# Patient Record
Sex: Female | Born: 1935 | Race: White | Hispanic: No | Smoking: Former smoker
Health system: Southern US, Community
[De-identification: ages and names within clinical notes are randomized; demographics above are authoritative.]

## PROBLEM LIST (undated history)

## (undated) DIAGNOSIS — J3489 Other specified disorders of nose and nasal sinuses: Secondary | ICD-10-CM

## (undated) DIAGNOSIS — C801 Malignant (primary) neoplasm, unspecified: Secondary | ICD-10-CM

## (undated) DIAGNOSIS — E7800 Pure hypercholesterolemia, unspecified: Secondary | ICD-10-CM

## (undated) DIAGNOSIS — H548 Legal blindness, as defined in USA: Secondary | ICD-10-CM

## (undated) DIAGNOSIS — K449 Diaphragmatic hernia without obstruction or gangrene: Secondary | ICD-10-CM

## (undated) DIAGNOSIS — I2699 Other pulmonary embolism without acute cor pulmonale: Secondary | ICD-10-CM

## (undated) DIAGNOSIS — I10 Essential (primary) hypertension: Secondary | ICD-10-CM

## (undated) DIAGNOSIS — H9190 Unspecified hearing loss, unspecified ear: Secondary | ICD-10-CM

## (undated) HISTORY — DX: Malignant (primary) neoplasm, unspecified: C80.1

## (undated) HISTORY — DX: Pure hypercholesterolemia, unspecified: E78.00

## (undated) HISTORY — PX: DILATION AND CURETTAGE OF UTERUS: SHX78

## (undated) HISTORY — DX: Diaphragmatic hernia without obstruction or gangrene: K44.9

## (undated) HISTORY — DX: Essential (primary) hypertension: I10

## (undated) HISTORY — DX: Other specified disorders of nose and nasal sinuses: J34.89

---

## 1992-07-09 HISTORY — PX: BLADDER REMOVAL: SHX567

## 1998-04-18 ENCOUNTER — Other Ambulatory Visit: Admission: RE | Admit: 1998-04-18 | Discharge: 1998-04-18 | Payer: Self-pay | Admitting: Family Medicine

## 2007-08-28 ENCOUNTER — Emergency Department (HOSPITAL_COMMUNITY): Admission: EM | Admit: 2007-08-28 | Discharge: 2007-08-28 | Payer: Self-pay | Admitting: Emergency Medicine

## 2007-10-05 ENCOUNTER — Encounter: Admission: RE | Admit: 2007-10-05 | Discharge: 2007-10-06 | Payer: Self-pay | Admitting: Ophthalmology

## 2009-01-14 ENCOUNTER — Emergency Department (HOSPITAL_COMMUNITY): Admission: EM | Admit: 2009-01-14 | Discharge: 2009-01-14 | Payer: Self-pay | Admitting: Emergency Medicine

## 2010-09-12 ENCOUNTER — Encounter
Admission: RE | Admit: 2010-09-12 | Discharge: 2010-09-12 | Payer: Self-pay | Source: Home / Self Care | Attending: Internal Medicine | Admitting: Internal Medicine

## 2010-09-13 ENCOUNTER — Emergency Department (HOSPITAL_COMMUNITY)
Admission: EM | Admit: 2010-09-13 | Discharge: 2010-09-14 | Payer: Self-pay | Source: Home / Self Care | Admitting: Emergency Medicine

## 2010-09-23 LAB — COMPREHENSIVE METABOLIC PANEL
ALT: 22 U/L (ref 0–35)
AST: 27 U/L (ref 0–37)
Albumin: 3.5 g/dL (ref 3.5–5.2)
Alkaline Phosphatase: 50 U/L (ref 39–117)
BUN: 13 mg/dL (ref 6–23)
CO2: 28 mEq/L (ref 19–32)
Calcium: 9.2 mg/dL (ref 8.4–10.5)
Chloride: 105 mEq/L (ref 96–112)
Creatinine, Ser: 1.2 mg/dL (ref 0.4–1.2)
GFR calc Af Amer: 50 mL/min — ABNORMAL LOW (ref >60)
GFR calc non Af Amer: 42 mL/min — ABNORMAL LOW (ref >60)
Glucose, Bld: 93 mg/dL (ref 70–99)
Potassium: 3.8 mEq/L (ref 3.5–5.1)
Sodium: 143 mEq/L (ref 135–145)
Total Bilirubin: 0.5 mg/dL (ref 0.3–1.2)
Total Protein: 5.9 g/dL — ABNORMAL LOW (ref 6.0–8.3)

## 2010-09-23 LAB — CBC
HCT: 43.4 % (ref 36.0–46.0)
Hemoglobin: 13.8 g/dL (ref 12.0–15.0)
MCH: 29.2 pg (ref 26.0–34.0)
MCHC: 31.8 g/dL (ref 30.0–36.0)
MCV: 91.8 fL (ref 78.0–100.0)
Platelets: 175 10*3/uL (ref 150–400)
RBC: 4.73 MIL/uL (ref 3.87–5.11)
RDW: 14 % (ref 11.5–15.5)
WBC: 5.6 10*3/uL (ref 4.0–10.5)

## 2010-09-23 LAB — PROTIME-INR
INR: 1.17 (ref 0.00–1.49)
Prothrombin Time: 15.1 seconds (ref 11.6–15.2)

## 2010-09-23 LAB — DIFFERENTIAL
Basophils Absolute: 0 10*3/uL (ref 0.0–0.1)
Basophils Relative: 0 % (ref 0–1)
Eosinophils Absolute: 0.1 10*3/uL (ref 0.0–0.7)
Eosinophils Relative: 2 % (ref 0–5)
Lymphocytes Relative: 48 % — ABNORMAL HIGH (ref 12–46)
Lymphs Abs: 2.7 10*3/uL (ref 0.7–4.0)
Monocytes Absolute: 0.5 10*3/uL (ref 0.1–1.0)
Monocytes Relative: 9 % (ref 3–12)
Neutro Abs: 2.3 10*3/uL (ref 1.7–7.7)
Neutrophils Relative %: 41 % — ABNORMAL LOW (ref 43–77)

## 2010-09-23 LAB — LIPASE, BLOOD: Lipase: 27 U/L (ref 11–59)

## 2010-09-23 LAB — APTT: aPTT: 31 seconds (ref 24–37)

## 2010-10-09 ENCOUNTER — Institutional Professional Consult (permissible substitution) (INDEPENDENT_AMBULATORY_CARE_PROVIDER_SITE_OTHER): Payer: MEDICARE | Admitting: Cardiovascular Disease

## 2010-10-09 DIAGNOSIS — M7989 Other specified soft tissue disorders: Secondary | ICD-10-CM

## 2010-10-09 DIAGNOSIS — I498 Other specified cardiac arrhythmias: Secondary | ICD-10-CM

## 2010-10-09 DIAGNOSIS — I119 Hypertensive heart disease without heart failure: Secondary | ICD-10-CM

## 2011-02-10 ENCOUNTER — Encounter (INDEPENDENT_AMBULATORY_CARE_PROVIDER_SITE_OTHER): Payer: Self-pay | Admitting: General Surgery

## 2011-05-22 ENCOUNTER — Ambulatory Visit (INDEPENDENT_AMBULATORY_CARE_PROVIDER_SITE_OTHER): Payer: Medicare Other | Admitting: *Deleted

## 2011-05-22 ENCOUNTER — Encounter: Payer: Medicare Other | Admitting: *Deleted

## 2011-05-22 DIAGNOSIS — I495 Sick sinus syndrome: Secondary | ICD-10-CM

## 2011-06-06 NOTE — Progress Notes (Signed)
Pacer checked by remote 

## 2011-09-20 ENCOUNTER — Ambulatory Visit (HOSPITAL_COMMUNITY)
Admission: RE | Admit: 2011-09-20 | Discharge: 2011-09-20 | Disposition: A | Discharge status: Home / Self Care | Payer: Medicare Other | Source: Ambulatory Visit | Attending: Family Medicine | Admitting: Family Medicine

## 2011-09-20 ENCOUNTER — Ambulatory Visit: Admit: 2011-09-20 | Payer: Self-pay | Admitting: Family Medicine

## 2011-09-20 DIAGNOSIS — M79609 Pain in unspecified limb: Secondary | ICD-10-CM

## 2011-09-20 DIAGNOSIS — M79606 Pain in leg, unspecified: Secondary | ICD-10-CM

## 2011-09-20 DIAGNOSIS — M7989 Other specified soft tissue disorders: Secondary | ICD-10-CM

## 2011-09-20 DIAGNOSIS — R52 Pain, unspecified: Secondary | ICD-10-CM

## 2011-09-22 NOTE — Progress Notes (Signed)
Bilateral lower extremity venous duplex completed. No evidence of DVT.superficial thrombosis, or Baker's cyst Milta Deiters, RVS, [place] D 09/22/2011, 12:15 PM

## 2015-07-22 ENCOUNTER — Emergency Department (HOSPITAL_BASED_OUTPATIENT_CLINIC_OR_DEPARTMENT_OTHER): Payer: Medicare Other

## 2015-07-22 ENCOUNTER — Emergency Department (HOSPITAL_BASED_OUTPATIENT_CLINIC_OR_DEPARTMENT_OTHER)
Admission: EM | Admit: 2015-07-22 | Discharge: 2015-07-22 | Disposition: A | Discharge status: Home / Self Care | Payer: Medicare Other | Attending: Emergency Medicine | Admitting: Emergency Medicine

## 2015-07-22 ENCOUNTER — Encounter (HOSPITAL_BASED_OUTPATIENT_CLINIC_OR_DEPARTMENT_OTHER): Payer: Self-pay

## 2015-07-22 DIAGNOSIS — Y92009 Unspecified place in unspecified non-institutional (private) residence as the place of occurrence of the external cause: Secondary | ICD-10-CM

## 2015-07-22 DIAGNOSIS — I10 Essential (primary) hypertension: Secondary | ICD-10-CM | POA: Diagnosis not present

## 2015-07-22 DIAGNOSIS — W19XXXA Unspecified fall, initial encounter: Secondary | ICD-10-CM

## 2015-07-22 DIAGNOSIS — W1839XA Other fall on same level, initial encounter: Secondary | ICD-10-CM | POA: Insufficient documentation

## 2015-07-22 DIAGNOSIS — S0990XA Unspecified injury of head, initial encounter: Secondary | ICD-10-CM | POA: Diagnosis not present

## 2015-07-22 DIAGNOSIS — M7918 Myalgia, other site: Secondary | ICD-10-CM

## 2015-07-22 DIAGNOSIS — Z8709 Personal history of other diseases of the respiratory system: Secondary | ICD-10-CM | POA: Insufficient documentation

## 2015-07-22 DIAGNOSIS — Y998 Other external cause status: Secondary | ICD-10-CM | POA: Insufficient documentation

## 2015-07-22 DIAGNOSIS — Z8639 Personal history of other endocrine, nutritional and metabolic disease: Secondary | ICD-10-CM | POA: Diagnosis not present

## 2015-07-22 DIAGNOSIS — Z79899 Other long term (current) drug therapy: Secondary | ICD-10-CM | POA: Insufficient documentation

## 2015-07-22 DIAGNOSIS — S5011XA Contusion of right forearm, initial encounter: Secondary | ICD-10-CM | POA: Insufficient documentation

## 2015-07-22 DIAGNOSIS — Y9389 Activity, other specified: Secondary | ICD-10-CM | POA: Diagnosis not present

## 2015-07-22 DIAGNOSIS — Y92019 Unspecified place in single-family (private) house as the place of occurrence of the external cause: Secondary | ICD-10-CM | POA: Diagnosis not present

## 2015-07-22 DIAGNOSIS — Z8551 Personal history of malignant neoplasm of bladder: Secondary | ICD-10-CM | POA: Insufficient documentation

## 2015-07-22 DIAGNOSIS — Z8719 Personal history of other diseases of the digestive system: Secondary | ICD-10-CM | POA: Diagnosis not present

## 2015-07-22 DIAGNOSIS — S59911A Unspecified injury of right forearm, initial encounter: Secondary | ICD-10-CM | POA: Diagnosis present

## 2015-07-22 LAB — URINALYSIS, ROUTINE W REFLEX MICROSCOPIC
Bilirubin Urine: NEGATIVE
Glucose, UA: NEGATIVE mg/dL
Hgb urine dipstick: NEGATIVE
Ketones, ur: NEGATIVE mg/dL
Nitrite: NEGATIVE
Protein, ur: NEGATIVE mg/dL
Specific Gravity, Urine: 1.019 (ref 1.005–1.030)
Urobilinogen, UA: 1 mg/dL (ref 0.0–1.0)
pH: 6.5 (ref 5.0–8.0)

## 2015-07-22 LAB — URINE MICROSCOPIC-ADD ON

## 2015-07-22 NOTE — ED Provider Notes (Signed)
CSN: [id number]     Arrival date & time 07/22/15  1056 History   First MD Initiated Contact with Patient 07/22/15 1201     Chief Complaint  Patient presents with  . Fall     (Consider location/radiation/quality/duration/timing/severity/associated sxs/prior Treatment) HPI   Blood pressure 145/45, pulse 54, temperature 97.9 F (36.6 C), temperature source Oral, resp. rate 18, height 5\' 4"  (1.626 m), weight 152 lb (68.947 kg), SpO2 96 %.  Savannah Parks is a 81 y.o. female with past medical history significant for hypertension, high cholesterol hip worse on the right pain status post mechanical fall 5 days ago. Patient states she was sitting down into her recliner, when she sat down she was too far forward on the seat and fell onto her right side impacting a stool that was nearby. There was head trauma without loss of consciousness. Patient is not anticoagulated, she takes a low-dose daily aspirin. She's been ambulatory with a walker since the event, this is her baseline. She hasn't taken any pain medication since the event. Patient denies headache, vomiting, numbness, weakness. She states that she has neck, bilateral posterior thoracic pain, chest pain only with movement and hip pain exacerbated by movement and walking. She denies shortness of breath, abdominal pain, change in vision. Patient received a ocular shot for macular degeneration last week. As per son patient has frequent UTIs. Patient denies dysuria or urinary frequency, fever, chills, chest pain at rest.  Past Medical History  Diagnosis Date  . Hypertension   . High cholesterol   . Hiatal hernia   . Cancer [facility])     Bladder  . Sinus drainage    Past Surgical History  Procedure Laterality Date  . Bladder removal  Nov. 1993    part  . Dilation and curettage of uterus      50+ years   Family History  Problem Relation Age of Onset  . Cancer Father   . Cancer Brother   . Cancer Sister    Social History  Substance Use  Topics  . Smoking status: Never Smoker   . Smokeless tobacco: None  . Alcohol Use: No   OB History    No data available     Review of Systems  10 systems reviewed and found to be negative, except as noted in the HPI.  Allergies  Keflet and Sulfur  Home Medications   Prior to Admission medications   Medication Sig Start Date End Date Taking? Authorizing Provider  atenolol (TENORMIN) 50 MG tablet Take 50 mg by mouth daily. 2 tablets daily     Historical Provider, MD  Calcium Carbonate (CALCIUM 600 PO) Take by mouth daily.      Historical Provider, MD  Cholecalciferol (VITAMIN D PO) Take 0.25 mg by mouth. Vitamin D21 1 capsule every two weeks.     Historical Provider, MD  Flaxseed, Linseed, 1000 MG CAPS Take by mouth daily.      Historical Provider, MD  furosemide (LASIX) 20 MG tablet Take 20 mg by mouth daily.      Historical Provider, MD   BP 145/45 mmHg  Pulse 54  Temp(Src) 97.9 F (36.6 C) (Oral)  Resp 18  Ht 5\' 4"  (1.626 m)  Wt 152 lb (68.947 kg)  BMI 26.08 kg/m2  SpO2 96% Physical Exam  Constitutional: She is oriented to person, place, and time. She appears well-developed and well-nourished.  HENT:  Head: Normocephalic and atraumatic.  Mouth/Throat: Oropharynx is clear and moist.  No abrasions or  contusions.   No hemotympanum, battle signs or raccoon's eyes  No crepitance or tenderness to palpation along the orbital rim.  EOMI intact with no pain or diplopia  No abnormal otorrhea or rhinorrhea. Nasal septum midline.  No intraoral trauma.  Eyes: Conjunctivae are normal.  Bilateral pupils are round and irregularly reactive.  Patient legally blind  Neck: Normal range of motion. Neck supple.  + midline C-spine  tenderness to palpation No step-offs appreciated.  Grip strength, biceps, triceps 5/5 bilaterally;  can differentiate between pinprick and light touch bilaterally.   No anteriolateral hematomas/bruits   Cardiovascular: Normal rate, regular rhythm  and intact distal pulses.   Pulmonary/Chest: Effort normal and breath sounds normal. No respiratory distress. She has no wheezes. She has no rales. She exhibits no tenderness.  No ecchymosis crepitance or tenderness to palpation, lung sounds are clear bilaterally  Abdominal: Soft. Bowel sounds are normal. She exhibits no distension and no mass. There is no tenderness. There is no rebound and no guarding.  No ecchymosis or tenderness to palpation  Musculoskeletal: Normal range of motion. She exhibits edema. She exhibits no tenderness.  No deformity, pelvis is stable, patient can lift both legs up off the bed, there is no tenderness to ankles or knees. Patient can abduct both shoulders greater than 90. Full range of motion to elbow with no bony tenderness. No snuffbox tenderness bilaterally.  2+ pitting edema to bilateral lower extremities which is at her baseline as per patient and son  Neurological: She is alert and oriented to person, place, and time.  Strength 5/5 x4 extremities   Distal sensation intact  Skin: Skin is warm.  Patient has ecchymosis to right forearm  Psychiatric: She has a normal mood and affect.  Nursing note and vitals reviewed.   ED Course  Procedures (including critical care time) Labs Review Labs Reviewed  URINALYSIS, ROUTINE W REFLEX MICROSCOPIC (NOT AT [facility])    Imaging Review No results found. I have personally reviewed and evaluated these images and lab results as part of my medical decision-making.   EKG Interpretation None      MDM   Final diagnoses:  Musculoskeletal pain  Fall at home, initial encounter    Filed Vitals:   07/22/15 1100  BP: 145/45  Pulse: 54  Temp: 97.9 F (36.6 C)  TempSrc: Oral  Resp: 18  Height: 5\' 4"  (1.626 m)  Weight: 152 lb (68.947 kg)  SpO2: 96%    Savannah Parks is 81 y.o. female presenting with cervical, chest, hip and upper back pain status post fall several days ago. Patient is not anticoagulated and was  no loss of consciousness however there was head. Neuro exam today is normal. Patient declines pain medication.  Imaging negative.  UA not consistent with infection, will culture.  This is a shared visit with the attending physician who personally evaluated the patient and agrees with the care plan.    Evaluation does not show pathology that would require ongoing emergent intervention or inpatient treatment. Pt is hemodynamically stable and mentating appropriately. Discussed findings and plan with patient/guardian, who agrees with care plan. All questions answered. Return precautions discussed and outpatient follow up given.     Monico Blitz, PA-C 07/22/15 1638  Harvel Quale, MD 07/22/15 [phone]

## 2015-07-22 NOTE — Discharge Instructions (Signed)
Take acetaminophen (Tylenol) up to 650 mg (this is normally 2 over-the-counter pills) up to every 4-6 hours as needed for pain control. Make sure your other medications do not contain acetaminophen (Read the labels!) ° °Please follow with your primary care doctor in the next 2 days for a check-up. They must obtain records for further management.  ° °Do not hesitate to return to the Emergency Department for any new, worsening or concerning symptoms.  ° °

## 2015-07-22 NOTE — ED Notes (Signed)
Patient here after fall on Wednesday night. [place] forward while trying to sit in recliner. Complains of pain to right hip, bilateral shoulders, chest, back and neck with any movement.

## 2015-07-23 LAB — URINE CULTURE: Culture: 9000

## 2016-08-15 ENCOUNTER — Encounter (HOSPITAL_COMMUNITY): Payer: Self-pay | Admitting: Emergency Medicine

## 2016-08-15 ENCOUNTER — Observation Stay (HOSPITAL_COMMUNITY)
Admission: EM | Admit: 2016-08-15 | Discharge: 2016-08-18 | Disposition: A | Discharge status: Skilled Nursing Facility | Payer: Medicare Other | Attending: Internal Medicine | Admitting: Internal Medicine

## 2016-08-15 ENCOUNTER — Emergency Department (HOSPITAL_COMMUNITY): Payer: Medicare Other

## 2016-08-15 DIAGNOSIS — S42211A Unspecified displaced fracture of surgical neck of right humerus, initial encounter for closed fracture: Secondary | ICD-10-CM | POA: Diagnosis not present

## 2016-08-15 DIAGNOSIS — S42209A Unspecified fracture of upper end of unspecified humerus, initial encounter for closed fracture: Secondary | ICD-10-CM | POA: Diagnosis present

## 2016-08-15 DIAGNOSIS — H548 Legal blindness, as defined in USA: Secondary | ICD-10-CM | POA: Diagnosis not present

## 2016-08-15 DIAGNOSIS — Z9842 Cataract extraction status, left eye: Secondary | ICD-10-CM | POA: Diagnosis not present

## 2016-08-15 DIAGNOSIS — R001 Bradycardia, unspecified: Secondary | ICD-10-CM | POA: Insufficient documentation

## 2016-08-15 DIAGNOSIS — K449 Diaphragmatic hernia without obstruction or gangrene: Secondary | ICD-10-CM | POA: Diagnosis not present

## 2016-08-15 DIAGNOSIS — Z9841 Cataract extraction status, right eye: Secondary | ICD-10-CM | POA: Insufficient documentation

## 2016-08-15 DIAGNOSIS — H9190 Unspecified hearing loss, unspecified ear: Secondary | ICD-10-CM | POA: Diagnosis not present

## 2016-08-15 DIAGNOSIS — Z8551 Personal history of malignant neoplasm of bladder: Secondary | ICD-10-CM | POA: Diagnosis not present

## 2016-08-15 DIAGNOSIS — Z882 Allergy status to sulfonamides status: Secondary | ICD-10-CM | POA: Insufficient documentation

## 2016-08-15 DIAGNOSIS — I700 Atherosclerosis of aorta: Secondary | ICD-10-CM | POA: Diagnosis not present

## 2016-08-15 DIAGNOSIS — W06XXXA Fall from bed, initial encounter: Secondary | ICD-10-CM | POA: Insufficient documentation

## 2016-08-15 DIAGNOSIS — E7800 Pure hypercholesterolemia, unspecified: Secondary | ICD-10-CM | POA: Diagnosis not present

## 2016-08-15 DIAGNOSIS — I10 Essential (primary) hypertension: Secondary | ICD-10-CM | POA: Insufficient documentation

## 2016-08-15 DIAGNOSIS — Z7982 Long term (current) use of aspirin: Secondary | ICD-10-CM | POA: Insufficient documentation

## 2016-08-15 DIAGNOSIS — I517 Cardiomegaly: Secondary | ICD-10-CM | POA: Insufficient documentation

## 2016-08-15 DIAGNOSIS — Z87891 Personal history of nicotine dependence: Secondary | ICD-10-CM | POA: Diagnosis not present

## 2016-08-15 DIAGNOSIS — Z79899 Other long term (current) drug therapy: Secondary | ICD-10-CM | POA: Insufficient documentation

## 2016-08-15 DIAGNOSIS — Z8489 Family history of other specified conditions: Secondary | ICD-10-CM | POA: Diagnosis not present

## 2016-08-15 DIAGNOSIS — S42201A Unspecified fracture of upper end of right humerus, initial encounter for closed fracture: Secondary | ICD-10-CM | POA: Diagnosis present

## 2016-08-15 DIAGNOSIS — R262 Difficulty in walking, not elsewhere classified: Secondary | ICD-10-CM

## 2016-08-15 DIAGNOSIS — M419 Scoliosis, unspecified: Secondary | ICD-10-CM | POA: Diagnosis not present

## 2016-08-15 DIAGNOSIS — R0781 Pleurodynia: Secondary | ICD-10-CM | POA: Diagnosis not present

## 2016-08-15 DIAGNOSIS — Z881 Allergy status to other antibiotic agents status: Secondary | ICD-10-CM | POA: Insufficient documentation

## 2016-08-15 DIAGNOSIS — S42271A Torus fracture of upper end of right humerus, initial encounter for closed fracture: Secondary | ICD-10-CM

## 2016-08-15 DIAGNOSIS — Z91048 Other nonmedicinal substance allergy status: Secondary | ICD-10-CM | POA: Insufficient documentation

## 2016-08-15 DIAGNOSIS — L8990 Pressure ulcer of unspecified site, unspecified stage: Secondary | ICD-10-CM | POA: Insufficient documentation

## 2016-08-15 DIAGNOSIS — Z720 Tobacco use: Secondary | ICD-10-CM | POA: Diagnosis not present

## 2016-08-15 DIAGNOSIS — M8580 Other specified disorders of bone density and structure, unspecified site: Secondary | ICD-10-CM | POA: Diagnosis not present
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
## 2016-08-16 ENCOUNTER — Emergency Department (HOSPITAL_COMMUNITY): Payer: Medicare Other

## 2016-08-16 ENCOUNTER — Encounter (HOSPITAL_COMMUNITY): Payer: Self-pay | Admitting: Family Medicine

## 2016-08-16 DIAGNOSIS — L8990 Pressure ulcer of unspecified site, unspecified stage: Secondary | ICD-10-CM | POA: Insufficient documentation

## 2016-08-16 DIAGNOSIS — S42271A Torus fracture of upper end of right humerus, initial encounter for closed fracture: Secondary | ICD-10-CM

## 2016-08-16 DIAGNOSIS — S42201G Unspecified fracture of upper end of right humerus, subsequent encounter for fracture with delayed healing: Secondary | ICD-10-CM

## 2016-08-16 DIAGNOSIS — I10 Essential (primary) hypertension: Secondary | ICD-10-CM | POA: Diagnosis present

## 2016-08-16 DIAGNOSIS — S42294A Other nondisplaced fracture of upper end of right humerus, initial encounter for closed fracture: Secondary | ICD-10-CM

## 2016-08-16 DIAGNOSIS — S42271S Torus fracture of upper end of right humerus, sequela: Secondary | ICD-10-CM

## 2016-08-16 DIAGNOSIS — R001 Bradycardia, unspecified: Secondary | ICD-10-CM

## 2016-08-16 DIAGNOSIS — S42209A Unspecified fracture of upper end of unspecified humerus, initial encounter for closed fracture: Secondary | ICD-10-CM | POA: Diagnosis present

## 2016-08-16 DIAGNOSIS — S42201D Unspecified fracture of upper end of right humerus, subsequent encounter for fracture with routine healing: Secondary | ICD-10-CM

## 2016-08-16 DIAGNOSIS — W19XXXS Unspecified fall, sequela: Secondary | ICD-10-CM

## 2016-08-16 LAB — I-STAT CHEM 8, ED
BUN: 20 mg/dL (ref 6–20)
CALCIUM ION: 1.03 mmol/L — AB (ref 1.15–1.40)
CHLORIDE: 105 mmol/L (ref 101–111)
Creatinine, Ser: 1 mg/dL (ref 0.44–1.00)
Glucose, Bld: 122 mg/dL — ABNORMAL HIGH (ref 65–99)
HCT: 31 % — ABNORMAL LOW (ref 36.0–46.0)
Hemoglobin: 10.5 g/dL — ABNORMAL LOW (ref 12.0–15.0)
Potassium: 3.8 mmol/L (ref 3.5–5.1)
SODIUM: 138 mmol/L (ref 135–145)
TCO2: 27 mmol/L (ref 0–100)

## 2016-08-16 MED ORDER — FENTANYL CITRATE (PF) 100 MCG/2ML IJ SOLN
50.0000 ug | Freq: Once | INTRAMUSCULAR | Status: AC
  Administered 2016-08-16: 50 ug via INTRAVENOUS
  Filled 2016-08-16: qty 2

## 2016-08-16 MED ORDER — ATENOLOL 25 MG PO TABS
25.0000 mg | ORAL_TABLET | Freq: Every day | ORAL | Status: DC
  Administered 2016-08-16 – 2016-08-17 (×2): 25 mg via ORAL
  Filled 2016-08-16 (×2): qty 1

## 2016-08-16 MED ORDER — ASPIRIN EC 81 MG PO TBEC
81.0000 mg | DELAYED_RELEASE_TABLET | Freq: Every day | ORAL | Status: DC
  Administered 2016-08-16 – 2016-08-18 (×3): 81 mg via ORAL
  Filled 2016-08-16 (×3): qty 1

## 2016-08-16 MED ORDER — LISINOPRIL 20 MG PO TABS
20.0000 mg | ORAL_TABLET | Freq: Every day | ORAL | Status: DC
  Administered 2016-08-16 – 2016-08-17 (×2): 20 mg via ORAL
  Filled 2016-08-16 (×2): qty 1

## 2016-08-16 MED ORDER — ACETAMINOPHEN 500 MG PO TABS
1000.0000 mg | ORAL_TABLET | Freq: Three times a day (TID) | ORAL | Status: DC
  Administered 2016-08-16 – 2016-08-18 (×7): 1000 mg via ORAL
  Filled 2016-08-16 (×7): qty 2

## 2016-08-16 MED ORDER — FUROSEMIDE 40 MG PO TABS
80.0000 mg | ORAL_TABLET | Freq: Every day | ORAL | Status: DC
  Administered 2016-08-16 – 2016-08-17 (×2): 80 mg via ORAL
  Filled 2016-08-16 (×2): qty 2

## 2016-08-16 MED ORDER — ENOXAPARIN SODIUM 30 MG/0.3ML ~~LOC~~ SOLN
30.0000 mg | SUBCUTANEOUS | Status: DC
  Administered 2016-08-16 – 2016-08-18 (×3): 30 mg via SUBCUTANEOUS
  Filled 2016-08-16 (×3): qty 0.3

## 2016-08-16 NOTE — Progress Notes (Signed)
Orthopedic Tech Progress Note Patient Details:  Savannah Parks [birth year] [id number]  Ortho Devices Type of Ortho Device: Sling immobilizer Ortho Device/Splint Location: rue Ortho Device/Splint Interventions: Ordered, Application   Karolee Stamps 08/16/2016, 1:58 AM

## 2016-08-16 NOTE — ED Notes (Signed)
Pt was able to ambulate around her room and into the hallway with no assistance, just held the pt's hand as reassurance.

## 2016-08-16 NOTE — H&P (Addendum)
History and Physical  Patient Name: Savannah Parks     [id number]    DOB: 26-Dec-1910    DOA: 08/15/2016 PCP: No primary care provider on file.   Patient coming from: Home  Chief Complaint: Fall, arm pain  HPI: Savannah Parks is a 82 y.o. female with a past medical history significant for advanced age, HTN, hard of hearing, legally blind who presents with fall and humerus fracture.  The patient was getting better tonight with the power went out at her house because of the snow. She stumbled around for a flashlight for a while, then went to sit on the end of her bed but sat to near the end and fell off landing on her right arm and ribs. Afterward she had severe constant right shoulder pain, right rib pain, and so she had her family bring her to the emergency room.  ED course: -Afebrile, heart rate 50, respirations pulse ox normal, blood pressure 175/76 -Na 138, K 3.8, Cr 1.0 -Radiographs showed comminuted right proximal humerus fracture -CXR without rib fractures -TRH were asked to place in observation for PT eval and CM consult tomorrow     ROS: Review of Systems  Musculoskeletal: Positive for joint pain (shoulder).  All other systems reviewed and are negative.         Past Medical History:  Diagnosis Date  . Cancer [facility])    Bladder  . Hiatal hernia   . High cholesterol   . Hypertension   . Sinus drainage     Past Surgical History:  Procedure Laterality Date  . BLADDER REMOVAL  Nov. 1993   part  . DILATION AND CURETTAGE OF UTERUS     50+ years    Social History: Patient lives alone, son lives next door.  The patient walks with a 4WW.  She is from [place].  Worked at [employer].  Remote former smoker.  Uses snuff now.    Allergies  Allergen Reactions  . Tape Other (See Comments)    SKIN IS VERY THIN; TAPE WILL TEAR AND BRUISE THE PATIENT'S SKIN!!  Doreatha Massed [Cephalexin] Rash  . Sulfa Antibiotics Rash  . Sulfur Rash    Family history: family history  includes Cancer in her brother, father, and sister.  Prior to Admission medications   Medication Sig Start Date End Date Taking? Authorizing Provider  alendronate (FOSAMAX) 70 MG tablet Take 70 mg by mouth every 7 (seven) days. 07/25/16  Yes Historical Provider, MD  aspirin EC 81 MG tablet Take 81 mg by mouth daily.   Yes Historical Provider, MD  atenolol (TENORMIN) 50 MG tablet Take 25-50 mg by mouth See admin instructions. 25 mg in the morning daily and may take an additional 25 mg later in the day, depending on B/P   Yes Historical Provider, MD  Calcium Carbonate (CALCIUM 600 PO) Take 1 tablet by mouth daily.    Yes Historical Provider, MD  Cholecalciferol (VITAMIN D-3) 1000 units CAPS Take 1 capsule by mouth daily.   Yes Historical Provider, MD  Flaxseed, Linseed, 1000 MG CAPS Take 1,000 mg by mouth daily.    Yes Historical Provider, MD  furosemide (LASIX) 80 MG tablet Take 80 mg by mouth daily. 05/15/16  Yes Historical Provider, MD  lisinopril (PRINIVIL,ZESTRIL) 20 MG tablet Take 20 mg by mouth daily. 06/13/16  Yes Historical Provider, MD  triamcinolone cream (KENALOG) 0.1 % Apply 1 application topically daily. TO LEGS 07/28/16  Yes Historical Provider, MD  Physical Exam: BP (!) 116/50   Pulse (!) 49   Temp 97.9 F (36.6 C) (Oral)   Resp 20   Ht 5\' 5"  (1.651 m)   Wt 70.8 kg (156 lb)   SpO2 96%   BMI 25.96 kg/m  General appearance: Overweight, very elderly adult female, alert and in no acute distress.   Eyes: Anicteric, conjunctiva pink, lids and lashes normal.  Left pupil reactive, right less so, given Macular deg.    ENT: No nasal deformity, discharge, epistaxis.  HOH. OP moist without lesions.   Skin: Warm and dry.  No jaundice.  Cardiac: Slow regular, nl S1-S2, no murmurs appreciated.  Capillary refill is brisk.  Respiratory: Normal respiratory rate and rhythm.  CTAB without rales or wheezes. Abdomen: Abdomen soft.  No TTP. No ascites, distension, hepatosplenomegaly.     MSK: Right arm in sling.  No cyanosis or clubbing. Neuro: Sensation intact to light touch. Speech is fluent.      Psych: Sensorium intact and responding to questions, attention normal.  Behavior appropriate.  Affect normal.  Judgment and insight appear normal.     Labs on Admission:  I have personally reviewed following labs and imaging studies: CBC:  Recent Labs Lab 08/16/16 0146  HGB 10.5*  HCT 0000000*   Basic Metabolic Panel:  Recent Labs Lab 08/16/16 0146  NA 138  K 3.8  CL 105  GLUCOSE 122*  BUN 20  CREATININE 1.00   GFR: Estimated Creatinine Clearance: 27.3 mL/min (by C-G formula based on SCr of 1 mg/dL).  Liver Function Tests: No results for input(s): AST, ALT, ALKPHOS, BILITOT, PROT, ALBUMIN in the last 168 hours. No results for input(s): LIPASE, AMYLASE in the last 168 hours. No results for input(s): AMMONIA in the last 168 hours. Coagulation Profile: No results for input(s): INR, PROTIME in the last 168 hours. Cardiac Enzymes: No results for input(s): CKTOTAL, CKMB, CKMBINDEX, TROPONINI in the last 168 hours. BNP (last 3 results) No results for input(s): PROBNP in the last 8760 hours. HbA1C: No results for input(s): HGBA1C in the last 72 hours. CBG: No results for input(s): GLUCAP in the last 168 hours. Lipid Profile: No results for input(s): CHOL, HDL, LDLCALC, TRIG, CHOLHDL, LDLDIRECT in the last 72 hours. Thyroid Function Tests: No results for input(s): TSH, T4TOTAL, FREET4, T3FREE, THYROIDAB in the last 72 hours. Anemia Panel: No results for input(s): VITAMINB12, FOLATE, FERRITIN, TIBC, IRON, RETICCTPCT in the last 72 hours. Sepsis Labs: Invalid input(s): PROCALCITONIN, LACTICIDVEN No results found for this or any previous visit (from the past 240 hour(s)).       Radiological Exams on Admission: Personally reviewed radiograph reports: Dg Shoulder Right  Result Date: 08/16/2016 CLINICAL DATA:  Fall landing on right arm and shoulder with right  shoulder pain. EXAM: RIGHT SHOULDER - 2+ VIEW COMPARISON:  None. FINDINGS: Comminuted mildly displaced fracture of the proximal humerus primarily involves the surgical neck. There is some displacement laterally about the humeral head. Limited assessment for glenohumeral extension. Acromioclavicular joint remains congruent. IMPRESSION: Comminuted mildly displaced proximal humerus fracture primarily involving the neck with lateral humeral head involvement. Electronically Signed   By: Jeb Levering M.D.   On: 08/16/2016 00:13   Ct Head Wo Contrast  Result Date: 08/16/2016 CLINICAL DATA:  Trip and fall with left frontal hematoma. Headache. Pain. EXAM: CT HEAD WITHOUT CONTRAST TECHNIQUE: Contiguous axial images were obtained from the base of the skull through the vertex without intravenous contrast. COMPARISON:  Head CT 07/22/2015 FINDINGS: Brain: No  intracranial hemorrhage. No subdural or extra-axial fluid collection. Stable generalized atrophy and chronic small vessel ischemia. No evidence of acute infarct. No midline shift or mass effect. Vascular: Atherosclerosis of skullbase vasculature without hyperdense vessel or abnormal calcification. Skull: No fracture or focal lesion. Sinuses/Orbits: Chronic mucosal thickening involving right side of sphenoid sinus. Post bilateral cataract resection. No acute abnormality. Other: None. IMPRESSION: No acute intracranial abnormality or skull fracture. Stable atrophy and chronic small vessel ischemia. Electronically Signed   By: Jeb Levering M.D.   On: 08/16/2016 00:17   Dg Chest Portable 1 View  Result Date: 08/16/2016 CLINICAL DATA:  Initial evaluation for acute right shoulder fracture. EXAM: PORTABLE CHEST 1 VIEW COMPARISON:  Prior radiograph from 07/22/2015. FINDINGS: Mild cardiomegaly, stable. Mediastinal silhouette within normal limits. Relative aortic atherosclerosis noted. Lungs are hypoinflated. Mild bibasilar atelectatic changes. No focal infiltrates. No  pulmonary edema or pleural effusion. No pneumothorax. Calcified granuloma noted at the left upper lobe. Comminuted fracture through the neck of the right humerus noted. No other acute osseous abnormality. Scoliosis. Osteopenia. Severe degenerative changes about the left shoulder. IMPRESSION: 1. Comminuted fracture through the neck of the right humerus. 2. No other active cardiopulmonary disease identified. 3. Aortic atherosclerosis. Electronically Signed   By: Jeannine Boga M.D.   On: 08/16/2016 02:00        Assessment/Plan  1. Proximal humerus fracture:    Advanced age, walker dependent.   -PT and OT eval -CM and SW consults to explore [facility] vs placement if possible -Likely home today, d/w family -Acetaminophen for pain, scheduled   2. HTN:  -Continue aspirin -Continue atenolol, furosemide, lisinopril  3. Bradycardia: Sinus on monitor.  Noted by PMD before.          DVT prophylaxis: Lovenox  Code Status: DO NOT RESUSCITATE  Family Communication: Son and daughter at bedside  Disposition Plan: Anticipate PT/OT eval, CM assistance with home heatlh and d/c today when arranged safely Consults called: None overnight Admission status: OBS At the point of initial evaluation, it is my clinical opinion that admission for OBSERVATION is reasonable and necessary because the patient's presenting complaints in the context of their chronic conditions represent sufficient risk of deterioration or significant morbidity to constitute reasonable grounds for close observation in the hospital setting, but that the patient may be medically stable for discharge from the hospital within 24 to 48 hours.    Medical decision making: Patient seen at 2:30 AM on 08/16/2016.  The patient was discussed with Dr. Neldon Labella.  What exists of the patient's chart was reviewed in depth and summarized above.  Clinical condition: stable.        Edwin Dada [facility] Pager [phone]

## 2016-08-16 NOTE — Progress Notes (Signed)
Partial PT Evaluation  To complete after speaking with family to finalize DC planning.     08/16/16 1109  PT Visit Information  Last PT Received On 08/16/16  Assistance Needed +1  History of Present Illness 82 yo female admitted on 08/15/16 following fall resulting in right humeral fracture. PMH significant for HTN, HOH, Leagally blind.  Precautions  Precautions Fall  Precaution Comments Had fall prior to admission that resulting in right proximal humerus fx  Required Braces or Orthoses Sling  Restrictions  Weight Bearing Restrictions Yes  RUE Weight Bearing NWB  Home Living  Family/patient expects to be discharged to: Private residence  Living Arrangements Alone  Available Help at Discharge Family;Available PRN/intermittently  Type of Home House  Home Access Stairs to enter  Entrance Stairs-Number of Steps 2  Home Layout One level  [place] - 4 wheels  Additional Comments Pt's son lives next door and her daughter is staying with him to recover from hip surgery.  Prior Function  Level of Independence Independent with assistive device(s)  Communication  Communication HOH  Bed Mobility  Overal bed mobility Needs Assistance  Bed Mobility Supine to Sit  Supine to sit Min assist;HOB elevated  General bed mobility comments Min A to bring trunk upright  Transfers  Overall transfer level Needs assistance  Equipment used None  Transfers Sit to/from Stand;Stand Pivot Transfers  Sit to Stand Min assist  Stand pivot transfers Min assist  General transfer comment 1 HHA to transfer from bed to chair with Min A for safety.   PT Recommendation  Follow Up Recommendations SNF  Written Expression  Dominant Hand Right  Scheryl Marten PT, DPT  [phone]

## 2016-08-16 NOTE — Progress Notes (Signed)
Patient ID: Savannah Parks, female   DOB: 1911/08/17, 82 y.o.   MRN: [id number]  Pt admitted after midnight. Please refer to admission note done 08/15/2016.  82 y.o. female with a past medical history significant for HTN, hard of hearing, legally blind who presented to [facility] status post fall at home. She was searching for flashlight as power went out. She sustained right proximal humerus fracture.   Assessment and Plan  Right proximal humerus fracture - Needs PT eval for safe discharge plan - Continue pain management efforts  Leisa Lenz [facility] [id number]

## 2016-08-16 NOTE — Progress Notes (Addendum)
Physical Therapy Evaluation Patient Details Name: Savannah Parks MRN: [id number] DOB: Mar 24, 1911 Today's Date: 08/16/2016   History of Present Illness     Clinical Impression  Pt presents 1 day following a fall. Pts power went out and she was searching for a flashlight and sat too close to the edge of her bed and fell into wall causing right humeral fx which is her dominant UE. Prior to admission, pt lives alone in single level home with son living next door. Pt performed gait with rollator and her son assisted with performing grocery shopping and transportation to MD appointments. Pt's son lives next door, but he is also limited due to age and health and is not able to assist pt 24hrs a day. Pt will benefit from SNF placement for short term rehab in order to improve strength and balance before returning home. Pt will benefit from continuing to be followed acutely in order to reduce and functional decline.     Follow Up Recommendations      Equipment Recommendations       Recommendations for Other Services       Precautions / Restrictions        Mobility  Bed Mobility                  Transfers                    Ambulation/Gait                Stairs            Wheelchair Mobility    Modified Rankin (Stroke Patients Only)       Balance                                             Pertinent Vitals/Pain      Home Living                        Prior Function                 Hand Dominance        Extremity/Trunk Assessment                         Communication      Cognition                            General Comments      Exercises     Assessment/Plan    PT Assessment    PT Problem List            PT Treatment Interventions      PT Goals (Current goals can be found in the Care Plan section)       Frequency     Barriers to discharge         Co-evaluation               End of Session                 Time:  -      Charges:         PT G Codes:        Scheryl Marten PT, DPT  [phone]  08/16/2016, 4:48  PM   

## 2016-08-16 NOTE — Evaluation (Signed)
Occupational Therapy Evaluation Patient Details Name: Savannah Parks MRN: [id number] DOB: 10-Oct-1910 Today's Date: 08/16/2016    History of Present Illness 82 yo female admitted on 08/15/16 following fall resulting in right humeral fracture. PMH significant for HTN, HOH, Legally blind.   Clinical Impression   Pt admitted with the above diagnoses and presents with below problem list. Pt will benefit from continued acute OT to address the below listed deficits and maximize independence with BADLs prior to d/c to venue below. PTA pt was independent with ADLs. Pt is currently min to mod A for most ADLs. SPT for toilet transfer. Need clarification on ROM restrictions, clearance for e/w/h ROM exercises and sling wearing schedule (off for bathing/dressing?).      Follow Up Recommendations  SNF;Supervision/Assistance - 24 hour    Equipment Recommendations  Other (comment) (defer to next venue)    Recommendations for Other Services       Precautions / Restrictions Precautions Precautions: Fall;Other (comment) (proximal R humural fracture) Precaution Comments: Had fall prior to admission that resulting in right proximal humerus fx Required Braces or Orthoses: Sling Restrictions Weight Bearing Restrictions: Yes RUE Weight Bearing: Non weight bearing      Mobility Bed Mobility Overal bed mobility: Needs Assistance Bed Mobility: Supine to Sit     Supine to sit: Min assist;HOB elevated     General bed mobility comments: up in chair  Transfers Overall transfer level: Needs assistance Equipment used: 1 person hand held assist;None Transfers: Sit to/from [facility] Sit to Stand: Min assist Stand pivot transfers: Min assist       General transfer comment: cues for technique. min A to steady and provide external support on left side.     Balance Overall balance assessment: History of Falls                                          ADL  Overall ADL's : Needs assistance/impaired Eating/Feeding: Set up;Sitting Eating/Feeding Details (indicate cue type and reason): setup lunch tray and oriented to placement of items due to visual impairment Grooming: Moderate assistance;Sitting   Upper Body Bathing: Sitting;Moderate assistance   Lower Body Bathing: Sit to/from stand;Maximal assistance   Upper Body Dressing : Moderate assistance;Sitting   Lower Body Dressing: Maximal assistance;Sit to/from stand   Toilet Transfer: Minimal assistance;Stand-pivot   Toileting- Clothing Manipulation and Hygiene: Moderate assistance;Sit to/from stand Toileting - Clothing Manipulation Details (indicate cue type and reason): Pt stood with min A while therapist completed pericare.       General ADL Comments: Pt completed SPT from recliner to/from [facility] for toilet transfer.      Vision Additional Comments: can see large shapes/obstacles but not details. "I can see that your face is right there but I can't tell how pretty you are."   Perception     Praxis      Pertinent Vitals/Pain Pain Assessment: Faces Faces Pain Scale: Hurts little more Pain Location: right shoulder and ribs Pain Descriptors / Indicators: Guarding Pain Intervention(s): Monitored during session;Limited activity within patient's tolerance;Repositioned     Hand Dominance Right   Extremity/Trunk Assessment Upper Extremity Assessment Upper Extremity Assessment: RUE deficits/detail RUE Deficits / Details: R proximal humeral fracture, NWB   Lower Extremity Assessment Lower Extremity Assessment: Defer to PT evaluation       Communication Communication Communication: HOH   Cognition Arousal/Alertness: Awake/alert Behavior During Therapy:  WFL for tasks assessed/performed Overall Cognitive Status: Within Functional Limits for tasks assessed                     General Comments       Exercises       Shoulder Instructions      Home Living  Family/patient expects to be discharged to:: Private residence Living Arrangements: Alone Available Help at Discharge: Family;Available PRN/intermittently Type of Home: House Home Access: Stairs to enter [employer] of Steps: 1 Entrance Stairs-Rails: None Home Layout: One level     Bathroom Shower/Tub: Teacher, early years/pre: Standard     Home Equipment: Environmental consultant - 4 wheels   Additional Comments: Pt's son lives next door and her daughter is staying with him to recover from hip surgery.      Prior Functioning/Environment Level of Independence: Independent with assistive device(s)        Comments: son checked in daily, performed ADLs and IADLs        OT Problem List: Impaired balance (sitting and/or standing);Decreased knowledge of use of DME or AE;Decreased knowledge of precautions;Pain;Impaired UE functional use   OT Treatment/Interventions: Self-care/ADL training;DME and/or AE instruction;Therapeutic activities;Patient/family education;Balance training    OT Goals(Current goals can be found in the care plan section) Acute Rehab OT Goals Patient Stated Goal: to get back home OT Goal Formulation: With patient/family Time For Goal Achievement: 08/23/16 Potential to Achieve Goals: Good ADL Goals Pt Will Perform Upper Body Bathing: with supervision;sitting Pt Will Perform Lower Body Bathing: with supervision;sit to/from stand Pt Will Perform Upper Body Dressing: with supervision;sitting Pt Will Perform Lower Body Dressing: sit to/from stand;with min guard assist Pt Will Transfer to Toilet: with min guard assist;ambulating;bedside commode Pt Will Perform Toileting - Clothing Manipulation and hygiene: sit to/from stand;with min guard assist  OT Frequency: Min 2X/week   Barriers to D/C:            Co-evaluation              End of Session Equipment Utilized During Treatment: Gait belt;Other (comment) (sling) Nurse Communication: Other  (comment) (nausea)  Activity Tolerance: Patient tolerated treatment well Patient left: in chair;with call bell/phone within reach;Other (comment) (family stepped out for pt to use [facility])   Time: [id number] OT Time Calculation (min): 21 min Charges:  OT General Charges $OT Visit: 1 Procedure OT Evaluation $OT Eval Low Complexity: 1 Procedure G-Codes:    Hortencia Pilar 08-20-2016, 12:26 PM

## 2016-08-17 DIAGNOSIS — S42201D Unspecified fracture of upper end of right humerus, subsequent encounter for fracture with routine healing: Secondary | ICD-10-CM | POA: Diagnosis not present

## 2016-08-17 DIAGNOSIS — I10 Essential (primary) hypertension: Secondary | ICD-10-CM | POA: Diagnosis not present

## 2016-08-17 MED ORDER — ACETAMINOPHEN 500 MG PO TABS: 1000.0000 mg | ORAL_TABLET | Freq: Three times a day (TID) | ORAL | 0 refills | Status: DC

## 2016-08-17 NOTE — Progress Notes (Signed)
Per report from [place], the patient was unable to be discharged to a SNF today because of a hypotensive episode. Plan is for patient to remain over night to be monitored and then may be able to DC 12/11. Will continue to monitor patient. Case management aware.

## 2016-08-17 NOTE — Clinical Social Work Placement (Addendum)
   CLINICAL SOCIAL WORK PLACEMENT  NOTE  Date:  08/17/2016  Patient Details  Name: Savannah Parks MRN: [id number] Date of Birth: [birth year]  Clinical Social Work is seeking post-discharge placement for this patient at the [place] level of care (*CSW will initial, date and re-position this form in  chart as items are completed):  Yes   Patient/family provided with [place] Work Department's list of facilities offering this level of care within the geographic area requested by the patient (or if unable, by the patient's family).  Yes   Patient/family informed of their freedom to choose among providers that offer the needed level of care, that participate in Medicare, Medicaid or managed care program needed by the patient, have an available bed and are willing to accept the patient.  Yes   Patient/family informed of [facility]'s ownership interest in [facility] and [facility], as well as of the fact that they are under no obligation to receive care at these facilities.  PASRR submitted to EDS on 08/17/16     PASRR number received on 08/17/16     Existing PASRR number confirmed on       FL2 transmitted to all facilities in geographic area requested by pt/family on 08/17/16     FL2 transmitted to all facilities within larger geographic area on       Patient informed that his/her managed care company has contracts with or will negotiate with certain facilities, including the following:        Yes   Patient/family informed of bed offers received.  Patient chooses bed at [facility]     Physician recommends and patient chooses bed at      Patient to be transferred to [facility] on 08/18/16.  Patient to be transferred to facility by Ambulance     Patient family notified on 08/18/16 of transfer.  Name of family member notified:  Tyrone Nine  PHYSICIAN Please prepare priority discharge summary, including medications,  Please prepare prescriptions, Please sign FL2, Please sign DNR     Additional Comment:    _______________________________________________ Sela Hilding, [place]

## 2016-08-17 NOTE — NC FL2 (Signed)
  [place] LEVEL OF CARE SCREENING TOOL     IDENTIFICATION  Patient Name: Savannah Parks Birthdate: 01/13/1911 Sex: female Admission Date (Current Location): 08/15/2016  [facility] and [place] Number:  Herbalist and Address:  [facility], [place] [address] [place], [place] [address]      Provider Number: [id number]  Attending Physician Name and Address:  Robbie Lis, MD  Relative Name and Phone Number:       Current Level of Care: Hospital Recommended Level of Care: [place] Prior Approval Number:    Date Approved/Denied:   PASRR Number:   [id number]   Discharge Plan: SNF    Current Diagnoses: Patient Active Problem List   Diagnosis Date Noted  . Proximal humeral fracture 08/16/2016  . Essential hypertension 08/16/2016  . Bradycardia 08/16/2016  . Pressure injury of skin 08/16/2016  . Closed torus fracture of upper end of right humerus     Orientation RESPIRATION BLADDER Height & Weight     Self, Time, Situation, Place  Normal Continent Weight: 156 lb (70.8 kg) Height:  5\' 5"  (165.1 cm)  BEHAVIORAL SYMPTOMS/MOOD NEUROLOGICAL BOWEL NUTRITION STATUS      Continent Diet (regular diet )  AMBULATORY STATUS COMMUNICATION OF NEEDS Skin   Limited Assist Verbally Normal                       Personal Care Assistance Level of Assistance  Bathing, Feeding, Dressing Bathing Assistance: Limited assistance Feeding assistance: Independent Dressing Assistance: Limited assistance     Functional Limitations Info             SPECIAL CARE FACTORS FREQUENCY  PT (By licensed PT), OT (By licensed OT)     PT Frequency: 5 OT Frequency: 5            Contractures      Additional Factors Info  Code Status, Allergies Code Status Info: DNR Allergies Info: TAPE, KEFLET CEPHALEXIN, SULFA ANTIBIOTICS, SULFUR            Current Medications (08/17/2016):  This is the current hospital active  medication list Current Facility-Administered Medications  Medication Dose Route Frequency Provider Last Rate Last Dose  . acetaminophen (TYLENOL) tablet 1,000 mg  1,000 mg Oral TID AC Edwin Dada, MD   1,000 mg at 08/17/16 1302  . aspirin EC tablet 81 mg  81 mg Oral Daily Edwin Dada, MD   81 mg at 08/17/16 0806  . atenolol (TENORMIN) tablet 25 mg  25 mg Oral Daily Edwin Dada, MD   25 mg at 08/17/16 0806  . enoxaparin (LOVENOX) injection 30 mg  30 mg Subcutaneous Q24H Edwin Dada, MD   30 mg at 08/17/16 0805  . furosemide (LASIX) tablet 80 mg  80 mg Oral Daily Edwin Dada, MD   80 mg at 08/17/16 0807  . lisinopril (PRINIVIL,ZESTRIL) tablet 20 mg  20 mg Oral Daily Edwin Dada, MD   20 mg at 08/17/16 0807     Discharge Medications: Please see discharge summary for a list of discharge medications.  Relevant Imaging Results:  Relevant Lab Results:   Additional Information SS#:[id number]  Weston Anna, LCSW

## 2016-08-17 NOTE — Clinical Social Work Placement (Signed)
   CLINICAL SOCIAL WORK PLACEMENT  NOTE  Date:  08/17/2016  Patient Details  Name: Merla Mendias MRN: [id number] Date of Birth: 1910-09-09  Clinical Social Work is seeking post-discharge placement for this patient at the [place] level of care (*CSW will initial, date and re-position this form in  chart as items are completed):  Yes   Patient/family provided with [place] Work Department's list of facilities offering this level of care within the geographic area requested by the patient (or if unable, by the patient's family).  Yes   Patient/family informed of their freedom to choose among providers that offer the needed level of care, that participate in Medicare, Medicaid or managed care program needed by the patient, have an available bed and are willing to accept the patient.  Yes   Patient/family informed of [facility]'s ownership interest in [facility] and [facility], as well as of the fact that they are under no obligation to receive care at these facilities.  PASRR submitted to EDS on 08/17/16     PASRR number received on 08/17/16     Existing PASRR number confirmed on       FL2 transmitted to all facilities in geographic area requested by pt/family on 08/17/16     FL2 transmitted to all facilities within larger geographic area on       Patient informed that his/her managed care company has contracts with or will negotiate with certain facilities, including the following:            Patient/family informed of bed offers received.  Patient chooses bed at       Physician recommends and patient chooses bed at      Patient to be transferred to   on  .  Patient to be transferred to facility by       Patient family notified on   of transfer.  Name of family member notified:        PHYSICIAN Please prepare priority discharge summary, including medications, Please prepare prescriptions, Please sign FL2, Please sign DNR      Additional Comment:    _______________________________________________ Rigoberto Noel, LCSW 08/17/2016, 2:20 PM
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
## 2016-08-17 NOTE — Progress Notes (Signed)
CM notes MD requesting SNF; CSW made aware.  CM will follow for progress.

## 2016-08-17 NOTE — Discharge Instructions (Signed)
Humerus Fracture Rehab Ask your health care provider which exercises are safe for you. Do exercises exactly as told by your health care provider and adjust them as directed. It is normal to feel mild stretching, pulling, tightness, or discomfort as you do these exercises, but you should stop right away if you feel sudden pain or your pain gets worse.Do not begin these exercises until told by your health care provider. Stretching and range of motion exercises These exercises warm up your muscles and joints and improve the movement and flexibility of your arm and shoulder. These exercises can also help to relieve pain and stiffness. Exercise A: Pendulum 1. Stand near a wall or a surface that you can hold onto for balance. 2. Bend at the waist and let your left / right arm hang straight down. Use your other arm to support you. 3. Relax your arm and shoulder muscles, and move your hips and your trunk so your left / right arm hangs freely. Your arm should swing because of the motion of your body, not because you are using your arm or shoulder muscles. 4. Keep moving so your arm swings in the following directions, as told by your health care provider:  Side to side.  Forward and backward.  In clockwise and counterclockwise circles. 5. Slowly return to the starting position. Repeat __________ times. Complete this exercise __________ times a day. Exercise B: Elbow extension, passive 1. Stand or sit with your left/ right elbow bent and your palm facing in, toward your body. 2. Straighten your left / right elbow as far as you can using only your arm muscles. 3. Straighten your elbow farther by gently pushing down on your forearm with your other hand. Do this until you feel a gentle stretch on the inside of your left/ right elbow. 4. Hold for __________ seconds. 5. Slowly return to the starting position. Repeat __________ times. Complete this exercise __________ times a day. Exercise C: Shoulder  abduction, standing 1. Stand and hold a broomstick, a cane, or a similar object. Place your hands a little more than shoulder-width apart on the object, with your palms facing up. 2. Push the object to raise your left/ right arm out to your side and then over your head. Use your other hand to help move the stick. Stop when you feel a stretch in your shoulder, or when you reach the angle that is recommended by your health care provider.  Avoid shrugging your shoulder while you raise your arm. Keep your shoulder blade tucked down toward your spine. 3. Hold for __________ seconds. 4. Slowly return to the starting position. Repeat __________ times. Complete this exercise __________ times a day. Exercise D: Shoulder flexion, standing 1. Stand facing a wall. Put your left / right hand on the wall. 2. Slide your left / right hand up the wall. Stop when you feel a stretch in your shoulder, or when you reach the angle that is recommended by your health care provider.  Use your other hand to help raise your arm, if needed.  As your hand gets higher, you may need to step closer to the wall.  Avoid shrugging your shoulder as you raise your arm. Keep your shoulder blade tucked down toward your spine. 3. Hold for __________ seconds. 4. Slowly return to the starting position. Use your other arm to help, if needed. Repeat __________ times. Complete this exercise __________ times a day. Exercise E: External rotation 1. Sit in a stable chair without armrests,  or stand. 2. Tuck a soft object, such as a folded towel or a small ball, under your left / right upper arm. 3. Hold a broomstick, a cane, or a similar object with your palms face-down, toward the floor. Bend your elbows to an "L" shape (90 degrees), and keep your hands about shoulder-width apart. 4. With your healthy arm, push the stick across your body. Keep your left / right arm bent. This will rotate your left / right forearm away from your  body. 5. Hold for __________ seconds. 6. Slowly return to the starting position. Repeat __________ times. Complete this exercise __________ times a day. Strengthening exercises These exercises build strength and endurance in your arm and shoulder. Endurance is the ability to use your muscles for a long time, even after your muscles get tired. Exercise F: Internal rotation, isometric 1. Stand or sit in a doorway, facing the door frame. 2. Bend your left / right elbow, and place the palm of your hand against the door frame. Only your hand should be touching the frame. Keep your upper arm at your side. 3. Gently press your hand against the door frame, as if you are trying to push your arm toward your abdomen.  Avoid shrugging your shoulder while you press your hand into the door frame. Keep your shoulder blade tucked down toward your spine. 4. Hold for __________ seconds. 5. Slowly release the tension, and relax your muscles completely before you repeat the exercise. Repeat __________ times. Complete this exercise __________ times a day. Exercise G: External rotation, isometric 1. Stand or sit in a doorway, facing the door frame. 2. Bend your left / right elbow, and place the back of your wrist against the door frame. Only your wrist should be touching the frame. Keep your upper arm at your side. 3. Gently press your wrist against the door frame, as if you are trying to push your arm away from your abdomen.  Avoid shrugging your shoulder while you press your hand into the door frame. Keep your shoulder blade tucked down toward your spine. 4. Hold for __________ seconds. 5. Slowly release the tension, and relax your muscles completely before you repeat the exercise. Repeat __________ times. Complete this exercise __________ times a day. This information is not intended to replace advice given to you by your health care provider. Make sure you discuss any questions you have with your health care  provider. Document Released: 08/25/2005 Document Revised: 05/01/2016 Document Reviewed: 08/03/2015 Elsevier Interactive Patient Education  2017 [employer].

## 2016-08-18 NOTE — Progress Notes (Signed)
Pt seen and examined at the bedside. Medically stable. SBP in 140's this am. Holding all BP meds. Spoke with pt son and daughter at the bedside.  Pt medically stable for discharge today.   Please refer to discharge summary completed 08/17/2016.  Leisa Lenz [facility] [id number]

## 2016-08-18 NOTE — Clinical Social Work Note (Signed)
Clinical Social Work Assessment  Patient Details  Name: Savannah Parks MRN: [id number] Date of Birth: 1911/09/05  Date of referral:  08/18/16               Reason for consult:  Facility Placement                Permission sought to share information with:  Family Supports Permission granted to share information::     Name::     Writer::     Relationship::  Son  Sport and exercise psychologist Information:     Housing/Transportation Living arrangements for the past 2 months:  Single Family Home Source of Information:  Patient Patient Interpreter Needed:  None Criminal Activity/Legal Involvement Pertinent to Current Situation/Hospitalization:  No - Comment as needed Significant Relationships:  Adult Children Lives with:  Self Do you feel safe going back to the place where you live?  Yes Need for family participation in patient care:  Yes (Comment)  Care giving concerns: Pt's son at bedside. Pt's son denies any concerns at this time.   Social Worker assessment / plan:  Pt is alert and oriented. CSW spoke with pt at bedside. Pt is agreeable to SNF placement at this time. Pt and pt's family prefers [facility]. CSW will reach out to facility. CSW will continue to provide support.  Employment status:  Retired Nurse, adult PT Recommendations:  [place] / Referral to community resources:  [place]  Patient/Family's Response to care:  Pt verbalized understanding of CSW role and appreciation of support. Pt denies any concerns regarding pt care at this time.  Patient/Family's Understanding of and Emotional Response to Diagnosis, Current Treatment, and Prognosis: Pt understanding and realistic regarding physical limitations.  Pt is agreeable to SNF placement at this time. Pt denies any questions regarding treatment plant at this time.  Emotional Assessment Appearance:  Appears stated age Attitude/Demeanor/Rapport:   (Patient  was appropriate.) Affect (typically observed):  Accepting, Appropriate Orientation:  Oriented to Self, Oriented to Place, Oriented to  Time, Oriented to Situation Alcohol / Substance use:  Not Applicable Psych involvement (Current and /or in the community):  No (Comment)  Discharge Needs  Concerns to be addressed:  No discharge needs identified Readmission within the last 30 days:  No Current discharge risk:  Dependent with Mobility Barriers to Discharge:  Continued Medical Work up   [employer], LCSW 08/18/2016, 10:07 AM

## 2016-08-18 NOTE — Clinical Social Work Note (Signed)
Clinical Social Worker facilitated patient discharge including contacting patient family and facility to confirm patient discharge plans.  Clinical information faxed to facility and family agreeable with plan.  CSW arranged ambulance transport via [facility] to [facility] .  RN to call [phone] room 121a for report prior to discharge.  Clinical Social Worker will sign off for now as social work intervention is no longer needed. Please consult [place] again if new need arises.  [address] [place]

## 2016-08-18 NOTE — Progress Notes (Signed)
Reviewed discharge information/medications with patient.  Patient had no questions.  Tried calling report to [employer].  Was transferred twice to a line where no one picked up.  Will try calling report again.

## 2016-08-25 ENCOUNTER — Emergency Department (HOSPITAL_COMMUNITY): Payer: Medicare Other

## 2016-08-25 ENCOUNTER — Encounter (HOSPITAL_COMMUNITY): Payer: Self-pay

## 2016-08-25 ENCOUNTER — Emergency Department (HOSPITAL_BASED_OUTPATIENT_CLINIC_OR_DEPARTMENT_OTHER)
Admit: 2016-08-25 | Discharge: 2016-08-25 | Disposition: A | Discharge status: Home / Self Care | Payer: Medicare Other | Attending: Emergency Medicine | Admitting: Emergency Medicine

## 2016-08-25 ENCOUNTER — Emergency Department (HOSPITAL_COMMUNITY)
Admission: EM | Admit: 2016-08-25 | Discharge: 2016-08-25 | Disposition: A | Discharge status: Home / Self Care | Payer: Medicare Other | Attending: Emergency Medicine | Admitting: Emergency Medicine

## 2016-08-25 DIAGNOSIS — R600 Localized edema: Secondary | ICD-10-CM

## 2016-08-25 DIAGNOSIS — Z87891 Personal history of nicotine dependence: Secondary | ICD-10-CM | POA: Diagnosis not present

## 2016-08-25 DIAGNOSIS — M7989 Other specified soft tissue disorders: Secondary | ICD-10-CM

## 2016-08-25 DIAGNOSIS — Y929 Unspecified place or not applicable: Secondary | ICD-10-CM | POA: Insufficient documentation

## 2016-08-25 DIAGNOSIS — S42201A Unspecified fracture of upper end of right humerus, initial encounter for closed fracture: Secondary | ICD-10-CM

## 2016-08-25 DIAGNOSIS — W19XXXA Unspecified fall, initial encounter: Secondary | ICD-10-CM | POA: Insufficient documentation

## 2016-08-25 DIAGNOSIS — Y999 Unspecified external cause status: Secondary | ICD-10-CM | POA: Insufficient documentation

## 2016-08-25 DIAGNOSIS — S42291A Other displaced fracture of upper end of right humerus, initial encounter for closed fracture: Secondary | ICD-10-CM | POA: Insufficient documentation

## 2016-08-25 DIAGNOSIS — Y939 Activity, unspecified: Secondary | ICD-10-CM | POA: Diagnosis not present

## 2016-08-25 DIAGNOSIS — S4991XA Unspecified injury of right shoulder and upper arm, initial encounter: Secondary | ICD-10-CM | POA: Diagnosis present

## 2016-08-25 DIAGNOSIS — I10 Essential (primary) hypertension: Secondary | ICD-10-CM | POA: Insufficient documentation

## 2016-08-25 DIAGNOSIS — Z7982 Long term (current) use of aspirin: Secondary | ICD-10-CM | POA: Insufficient documentation

## 2016-08-25 DIAGNOSIS — Z8551 Personal history of malignant neoplasm of bladder: Secondary | ICD-10-CM | POA: Diagnosis not present

## 2016-08-25 NOTE — Progress Notes (Signed)
**  Preliminary report by tech**  Right upper extremity venous duplex complete. There is no obvious evidence of deep or superficial vein thrombosis involving the right upper extremity. All clearly visualized vessels appear patent and compressible. Results were given to Dr. Alvino Chapel.  08/25/16 8:54 PM Carlos Levering RVT

## 2016-08-25 NOTE — ED Notes (Signed)
[facility] at bedside for pt.  

## 2016-08-25 NOTE — ED Triage Notes (Signed)
Per EMS: Pt from [employer]. Pt fell on 12/8, fx to R humoral head. Family saw pt today, noticed increased discoloration and swelling to R arm. Pt denies any pain. Pt with good ROM and 2+ radial pulse. Pt with good cap refill and full sensation.

## 2016-08-25 NOTE — ED Notes (Signed)
Pt transported to xray 

## 2016-08-25 NOTE — ED Notes (Signed)
Patient ambulated with assistance with RN and tech to restroom.

## 2016-08-25 NOTE — ED Provider Notes (Signed)
[facility] DEPT Provider Note   CSN: [id number] Arrival date & time: 08/25/16  1551     History   Chief Complaint Chief Complaint  Patient presents with  . Arm Injury    Old    HPI Savannah Parks is a 82 y.o. female.  HPI Patient had a fall 10 days ago and fractured her right humeral head. Family reports our day and some more swelling in the arm. More bruising and increased swelling in the right upper extremity. Patient states she feels fine. No shortness of breath. She does have occasional cough. No fevers or chills. No new injury. She states it hurts some but not too bad.   Past Medical History:  Diagnosis Date  . Cancer [facility])    Bladder  . Hiatal hernia   . High cholesterol   . Hypertension   . Sinus drainage     Patient Active Problem List   Diagnosis Date Noted  . Proximal humeral fracture 08/16/2016  . Essential hypertension 08/16/2016  . Bradycardia 08/16/2016  . Pressure injury of skin 08/16/2016  . Closed torus fracture of upper end of right humerus     Past Surgical History:  Procedure Laterality Date  . BLADDER REMOVAL  Nov. 1993   part  . DILATION AND CURETTAGE OF UTERUS     50+ years    OB History    No data available       Home Medications    Prior to Admission medications   Medication Sig Start Date End Date Taking? Authorizing Provider  acetaminophen (TYLENOL) 500 MG tablet Take 2 tablets (1,000 mg total) by mouth 3 (three) times daily before meals. 08/17/16  Yes Robbie Lis, MD  alendronate (FOSAMAX) 70 MG tablet Take 70 mg by mouth every 7 (seven) days. Take usually on Mondays or Tuesdays 07/25/16  Yes Historical Provider, MD  aspirin EC 81 MG tablet Take 81 mg by mouth daily.   Yes Historical Provider, MD  Calcium Carbonate (CALCIUM 600 PO) Take 1 tablet by mouth daily.    Yes Historical Provider, MD  Cholecalciferol (VITAMIN D-3) 1000 units CAPS Take 1 capsule by mouth daily.   Yes Historical Provider, MD  Flaxseed, Linseed,  1000 MG CAPS Take 1,000 mg by mouth daily.    Yes Historical Provider, MD  naphazoline-pheniramine (NAPHCON-A) 0.025-0.3 % ophthalmic solution Place 1 drop into both eyes daily.   Yes Historical Provider, MD  triamcinolone cream (KENALOG) 0.1 % Apply 1 application topically daily. TO LEGS 07/28/16  Yes Historical Provider, MD    Family History Family History  Problem Relation Age of Onset  . Cancer Father   . Cancer Sister   . Cancer Brother     Social History Social History  Substance Use Topics  . Smoking status: Former Research scientist (life sciences)  . Smokeless tobacco: Current User    Types: Snuff  . Alcohol use No     Allergies   Tape; Keflet [cephalexin]; Sulfa antibiotics; and Sulfur   Review of Systems Review of Systems  Constitutional: Negative for appetite change.  Respiratory: Positive for cough. Negative for shortness of breath.   Cardiovascular: Negative for chest pain.  Gastrointestinal: Negative for abdominal pain.  Genitourinary: Negative for flank pain.  Musculoskeletal: Negative for back pain.  Skin: Negative for wound.  Hematological: Negative for adenopathy.  Psychiatric/Behavioral: Negative for confusion.     Physical Exam Updated Vital Signs BP 161/72   Pulse 79   Temp 98.3 F (36.8 C) (Oral)   Resp  17   SpO2 95%   Physical Exam  Constitutional: She appears well-developed.  HENT:  Head: Normocephalic.  Neck: Neck supple.  Cardiovascular: Normal rate.   Pulmonary/Chest: Effort normal. She has no wheezes.  Abdominal: Soft.  Musculoskeletal:  Right upper extremity in a sling. Moderate ecchymosis both on chest and proximal humeral area. There is moderate edema going from the shoulder all the way down to the hand. Pulse intact. Sensation grossly intact.  Skin: Skin is warm.  Psychiatric: She has a normal mood and affect.     ED Treatments / Results  Labs (all labs ordered are listed, but only abnormal results are displayed) Labs Reviewed - No data to  display  EKG  EKG Interpretation None       Radiology Dg Shoulder Right  Result Date: 08/25/2016 CLINICAL DATA:  Fall.  Pain. EXAM: RIGHT SHOULDER - 2+ VIEW COMPARISON:  None. FINDINGS: Comminuted fracture of the of proximal humerus is present. Fracture subcapital. Displaced fracture fragments are present. Diffuse severe osteopenia and degenerative change. Small right pleural effusion. IMPRESSION: 1.  Subcapital right humeral comminuted displaced fracture. 2.  Severe osteopenia.  Severe degenerative changes right shoulder. 3. Small right pleural effusion cannot be excluded . Electronically Signed   By: Marcello Moores  Register   On: 08/25/2016 16:50    Procedures Procedures (including critical care time)  Medications Ordered in ED Medications - No data to display   Initial Impression / Assessment and Plan / ED Course  I have reviewed the triage vital signs and the nursing notes.  Pertinent labs & imaging results that were available during my care of the patient were reviewed by me and considered in my medical decision making (see chart for details).  Clinical Course      her arm after fracture. Likely the swelling is related to the fracture. She has a pulse and no Doppler evidence of clot. Lungs are clear do not think she has a clinical pneumonia at this time.Will discharge home.  Final Clinical Impressions(s) / ED Diagnoses   Final diagnoses:  Edema of upper extremity  Closed fracture of proximal end of right humerus, unspecified fracture morphology, initial encounter    New Prescriptions Discharge Medication List as of 08/25/2016  7:47 PM       Davonna Belling, MD 08/26/16 [phone]

## 2016-08-25 NOTE — ED Notes (Signed)
Attempted report to facility.  Waited on hold 3+ minutes per call with no response.

## 2016-08-25 NOTE — ED Notes (Signed)
Vascular at the bedside. 

## 2016-08-25 NOTE — Progress Notes (Signed)
Orthopedic Tech Progress Note Patient Details:  Savannah Parks 1911-06-03 [id number]  Ortho Devices Type of Ortho Device: Sling immobilizer Ortho Device/Splint Location: RUE Ortho Device/Splint Interventions: Ordered, Application   Braulio Bosch 08/25/2016, 8:46 PM

## 2016-09-08 DIAGNOSIS — I2699 Other pulmonary embolism without acute cor pulmonale: Secondary | ICD-10-CM

## 2016-09-08 HISTORY — DX: Other pulmonary embolism without acute cor pulmonale: I26.99

## 2016-09-27 ENCOUNTER — Emergency Department (HOSPITAL_COMMUNITY): Payer: Medicare Other

## 2016-09-27 ENCOUNTER — Inpatient Hospital Stay (HOSPITAL_COMMUNITY)
Admission: EM | Admit: 2016-09-27 | Discharge: 2016-10-01 | DRG: 175 | Disposition: A | Discharge status: Skilled Nursing Facility | Payer: Medicare Other | Attending: Internal Medicine | Admitting: Internal Medicine

## 2016-09-27 ENCOUNTER — Encounter (HOSPITAL_COMMUNITY): Payer: Self-pay | Admitting: Emergency Medicine

## 2016-09-27 DIAGNOSIS — J189 Pneumonia, unspecified organism: Secondary | ICD-10-CM | POA: Diagnosis not present

## 2016-09-27 DIAGNOSIS — A419 Sepsis, unspecified organism: Secondary | ICD-10-CM | POA: Diagnosis present

## 2016-09-27 DIAGNOSIS — Z7982 Long term (current) use of aspirin: Secondary | ICD-10-CM | POA: Diagnosis not present

## 2016-09-27 DIAGNOSIS — I10 Essential (primary) hypertension: Secondary | ICD-10-CM | POA: Diagnosis present

## 2016-09-27 DIAGNOSIS — J154 Pneumonia due to other streptococci: Secondary | ICD-10-CM | POA: Diagnosis present

## 2016-09-27 DIAGNOSIS — Z91048 Other nonmedicinal substance allergy status: Secondary | ICD-10-CM | POA: Diagnosis not present

## 2016-09-27 DIAGNOSIS — R262 Difficulty in walking, not elsewhere classified: Secondary | ICD-10-CM

## 2016-09-27 DIAGNOSIS — Z881 Allergy status to other antibiotic agents status: Secondary | ICD-10-CM | POA: Diagnosis not present

## 2016-09-27 DIAGNOSIS — E876 Hypokalemia: Secondary | ICD-10-CM | POA: Diagnosis present

## 2016-09-27 DIAGNOSIS — Z882 Allergy status to sulfonamides status: Secondary | ICD-10-CM | POA: Diagnosis not present

## 2016-09-27 DIAGNOSIS — D72828 Other elevated white blood cell count: Secondary | ICD-10-CM | POA: Diagnosis not present

## 2016-09-27 DIAGNOSIS — Z79899 Other long term (current) drug therapy: Secondary | ICD-10-CM | POA: Diagnosis not present

## 2016-09-27 DIAGNOSIS — E7800 Pure hypercholesterolemia, unspecified: Secondary | ICD-10-CM | POA: Diagnosis present

## 2016-09-27 DIAGNOSIS — Z66 Do not resuscitate: Secondary | ICD-10-CM | POA: Diagnosis present

## 2016-09-27 DIAGNOSIS — H9190 Unspecified hearing loss, unspecified ear: Secondary | ICD-10-CM | POA: Diagnosis present

## 2016-09-27 DIAGNOSIS — Z7983 Long term (current) use of bisphosphonates: Secondary | ICD-10-CM | POA: Diagnosis not present

## 2016-09-27 DIAGNOSIS — H548 Legal blindness, as defined in USA: Secondary | ICD-10-CM | POA: Diagnosis present

## 2016-09-27 DIAGNOSIS — Z8551 Personal history of malignant neoplasm of bladder: Secondary | ICD-10-CM

## 2016-09-27 DIAGNOSIS — Z87891 Personal history of nicotine dependence: Secondary | ICD-10-CM

## 2016-09-27 DIAGNOSIS — S42211D Unspecified displaced fracture of surgical neck of right humerus, subsequent encounter for fracture with routine healing: Secondary | ICD-10-CM

## 2016-09-27 DIAGNOSIS — I2699 Other pulmonary embolism without acute cor pulmonale: Principal | ICD-10-CM | POA: Diagnosis present

## 2016-09-27 DIAGNOSIS — J90 Pleural effusion, not elsewhere classified: Secondary | ICD-10-CM | POA: Diagnosis present

## 2016-09-27 DIAGNOSIS — Y95 Nosocomial condition: Secondary | ICD-10-CM | POA: Diagnosis present

## 2016-09-27 HISTORY — DX: Legal blindness, as defined in USA: H54.8

## 2016-09-27 HISTORY — DX: Other pulmonary embolism without acute cor pulmonale: I26.99

## 2016-09-27 HISTORY — DX: Unspecified hearing loss, unspecified ear: H91.90

## 2016-09-27 LAB — CBC
HEMATOCRIT: 36.6 % (ref 36.0–46.0)
HEMOGLOBIN: 11.4 g/dL — AB (ref 12.0–15.0)
MCH: 26.4 pg (ref 26.0–34.0)
MCHC: 31.1 g/dL (ref 30.0–36.0)
MCV: 84.7 fL (ref 78.0–100.0)
Platelets: 322 10*3/uL (ref 150–400)
RBC: 4.32 MIL/uL (ref 3.87–5.11)
RDW: 17.5 % — ABNORMAL HIGH (ref 11.5–15.5)
WBC: 11.4 10*3/uL — AB (ref 4.0–10.5)

## 2016-09-27 LAB — BASIC METABOLIC PANEL
ANION GAP: 11 (ref 5–15)
BUN: 12 mg/dL (ref 6–20)
CHLORIDE: 97 mmol/L — AB (ref 101–111)
CO2: 25 mmol/L (ref 22–32)
Calcium: 9.3 mg/dL (ref 8.9–10.3)
Creatinine, Ser: 0.89 mg/dL (ref 0.44–1.00)
GFR calc non Af Amer: 50 mL/min — ABNORMAL LOW (ref >60)
GFR, EST AFRICAN AMERICAN: 58 mL/min — AB (ref >60)
Glucose, Bld: 103 mg/dL — ABNORMAL HIGH (ref 65–99)
POTASSIUM: 4.1 mmol/L (ref 3.5–5.1)
SODIUM: 133 mmol/L — AB (ref 135–145)

## 2016-09-27 LAB — I-STAT TROPONIN, ED: Troponin i, poc: 0 ng/mL (ref 0.00–0.08)

## 2016-09-27 MED ORDER — SODIUM CHLORIDE 0.9% FLUSH
3.0000 mL | Freq: Two times a day (BID) | INTRAVENOUS | Status: DC
  Administered 2016-09-28: 3 mL via INTRAVENOUS

## 2016-09-27 MED ORDER — TRAMADOL HCL 50 MG PO TABS
50.0000 mg | ORAL_TABLET | Freq: Four times a day (QID) | ORAL | Status: DC | PRN
  Administered 2016-09-28 – 2016-09-30 (×4): 50 mg via ORAL
  Filled 2016-09-27 (×4): qty 1

## 2016-09-27 MED ORDER — SODIUM CHLORIDE 0.9% FLUSH
3.0000 mL | Freq: Two times a day (BID) | INTRAVENOUS | Status: DC
  Administered 2016-09-28 – 2016-10-01 (×3): 3 mL via INTRAVENOUS

## 2016-09-27 MED ORDER — HEPARIN BOLUS VIA INFUSION
3000.0000 [IU] | Freq: Once | INTRAVENOUS | Status: AC
  Administered 2016-09-27: 3000 [IU] via INTRAVENOUS
  Filled 2016-09-27: qty 3000

## 2016-09-27 MED ORDER — ONDANSETRON HCL 4 MG PO TABS: 4.0000 mg | ORAL_TABLET | Freq: Four times a day (QID) | ORAL | Status: DC | PRN

## 2016-09-27 MED ORDER — DEXTROSE 5 % IV SOLN
1.0000 g | Freq: Three times a day (TID) | INTRAVENOUS | Status: DC
  Administered 2016-09-28 – 2016-09-30 (×8): 1 g via INTRAVENOUS
  Filled 2016-09-27 (×9): qty 1

## 2016-09-27 MED ORDER — ACETAMINOPHEN 325 MG PO TABS
650.0000 mg | ORAL_TABLET | Freq: Four times a day (QID) | ORAL | Status: DC | PRN
  Filled 2016-09-27: qty 2

## 2016-09-27 MED ORDER — ONDANSETRON HCL 4 MG/2ML IJ SOLN: 4.0000 mg | Freq: Four times a day (QID) | INTRAMUSCULAR | Status: DC | PRN

## 2016-09-27 MED ORDER — SODIUM CHLORIDE 0.9 % IV SOLN: 250.0000 mL | INTRAVENOUS | Status: DC | PRN

## 2016-09-27 MED ORDER — ASPIRIN EC 81 MG PO TBEC
81.0000 mg | DELAYED_RELEASE_TABLET | Freq: Every day | ORAL | Status: DC
  Administered 2016-09-28 – 2016-10-01 (×4): 81 mg via ORAL
  Filled 2016-09-27 (×4): qty 1

## 2016-09-27 MED ORDER — POLYETHYLENE GLYCOL 3350 17 G PO PACK: 17.0000 g | PACK | Freq: Every day | ORAL | Status: DC | PRN

## 2016-09-27 MED ORDER — HYDROCODONE-ACETAMINOPHEN 5-325 MG PO TABS: 1.0000 | ORAL_TABLET | ORAL | Status: DC | PRN

## 2016-09-27 MED ORDER — HEPARIN (PORCINE) IN NACL 100-0.45 UNIT/ML-% IJ SOLN
1250.0000 [IU]/h | INTRAMUSCULAR | Status: DC
  Administered 2016-09-27: 950 [IU]/h via INTRAVENOUS
  Filled 2016-09-27 (×3): qty 250

## 2016-09-27 MED ORDER — IOPAMIDOL (ISOVUE-370) INJECTION 76%
INTRAVENOUS | Status: AC
  Administered 2016-09-27: 100 mL
  Filled 2016-09-27: qty 100

## 2016-09-27 MED ORDER — SODIUM CHLORIDE 0.9% FLUSH: 3.0000 mL | INTRAVENOUS | Status: DC | PRN

## 2016-09-27 MED ORDER — ACETAMINOPHEN 650 MG RE SUPP: 650.0000 mg | Freq: Four times a day (QID) | RECTAL | Status: DC | PRN

## 2016-09-27 NOTE — ED Triage Notes (Signed)
Patient from home with [facility] where she lives alone.  Patient was discharged from rehab approximately one week ago after falling at home and breaking her right arm.  Right arm currently in sling.  Patient complains of intermittent chest pain that started while in rehab, she describes the pain as "feeling like something is ripping apart" and points to the center of her chest and she rates the severity of the pain as 2/10.  Patient is alert and oriented and in no apparent distress at this time.

## 2016-09-27 NOTE — ED Provider Notes (Signed)
[facility] DEPT Provider Note   CSN: [id number] Arrival date & time: 09/27/16  1523     History   Chief Complaint Chief Complaint  Patient presents with  . Chest Pain    HPI Savannah Parks is a 83 y.o. female.  The history is provided by the patient and a relative.  Chest Pain   This is a new problem. Episode onset: 2 weeks. The problem occurs daily. The problem has been gradually worsening. The pain is associated with breathing. The pain is present in the substernal region. The pain is moderate. The quality of the pain is described as pleuritic and sharp. The pain does not radiate. The symptoms are aggravated by deep breathing. Associated symptoms include lower extremity edema. Pertinent negatives include no abdominal pain, no back pain, no cough, no diaphoresis, no exertional chest pressure, no fever, no hemoptysis, no nausea, no numbness, no palpitations, no shortness of breath and no weakness. Risk factors include being elderly and sedentary lifestyle.  Her past medical history is significant for hypertension.  Pertinent negatives for past medical history include no MI and no strokes.  Pt recently admitted for R humerus fx.  Past Medical History:  Diagnosis Date  . Cancer [facility])    Bladder  . Hiatal hernia   . High cholesterol   . Hypertension   . Sinus drainage     Patient Active Problem List   Diagnosis Date Noted  . Pulmonary embolism ([place]) 09/27/2016  . HCAP (healthcare-associated pneumonia) 09/27/2016  . Proximal humeral fracture 08/16/2016  . Essential hypertension 08/16/2016  . Bradycardia 08/16/2016  . Pressure injury of skin 08/16/2016  . Closed torus fracture of upper end of right humerus     Past Surgical History:  Procedure Laterality Date  . BLADDER REMOVAL  Nov. 1993   part  . DILATION AND CURETTAGE OF UTERUS     50+ years    OB History    No data available       Home Medications    Prior to Admission medications   Medication Sig  Start Date End Date Taking? Authorizing Provider  acetaminophen (TYLENOL) 500 MG tablet Take 2 tablets (1,000 mg total) by mouth 3 (three) times daily before meals. Patient taking differently: Take 1,000 mg by mouth 3 (three) times daily as needed for mild pain.  08/17/16  Yes Robbie Lis, MD  alendronate (FOSAMAX) 70 MG tablet Take 70 mg by mouth every 7 (seven) days. Take usually on Mondays or Tuesdays 07/25/16  Yes Historical Provider, MD  aspirin EC 81 MG tablet Take 81 mg by mouth daily.   Yes Historical Provider, MD  Calcium Carbonate (CALCIUM 600 PO) Take 1 tablet by mouth daily.    Yes Historical Provider, MD  Cholecalciferol (VITAMIN D-3) 1000 units CAPS Take 1 capsule by mouth daily.   Yes Historical Provider, MD  furosemide (LASIX) 80 MG tablet Take 80 mg by mouth daily as needed for fluid.   Yes Historical Provider, MD  naphazoline-pheniramine (NAPHCON-A) 0.025-0.3 % ophthalmic solution Place 1 drop into both eyes 4 (four) times daily as needed for irritation or allergies.    Yes Historical Provider, MD  omega-3 acid ethyl esters (LOVAZA) 1 g capsule Take 1 g by mouth daily.   Yes Historical Provider, MD  OVER THE COUNTER MEDICATION Take 1 capsule by mouth daily. arteclear Circulation 2   Yes Historical Provider, MD  triamcinolone cream (KENALOG) 0.1 % Apply 1 application topically daily as needed (rash). TO LEGS 07/28/16  Yes Historical Provider, MD    Family History Family History  Problem Relation Age of Onset  . Cancer Father   . Cancer Sister   . Cancer Brother     Social History Social History  Substance Use Topics  . Smoking status: Former Research scientist (life sciences)  . Smokeless tobacco: Current User    Types: Snuff  . Alcohol use No     Allergies   Tape; Keflet [cephalexin]; Sulfa antibiotics; and Sulfur   Review of Systems Review of Systems  Constitutional: Negative for diaphoresis and fever.  HENT: Negative.   Eyes: Negative.   Respiratory: Positive for chest tightness.  Negative for cough, hemoptysis and shortness of breath.   Cardiovascular: Positive for chest pain and leg swelling (chronic). Negative for palpitations.  Gastrointestinal: Negative for abdominal pain and nausea.  Genitourinary: Negative for difficulty urinating and dysuria.  Musculoskeletal: Negative for back pain and neck pain.  Neurological: Negative for weakness and numbness.     Physical Exam Updated Vital Signs BP 167/71   Pulse 80   Temp 97.9 F (36.6 C) (Oral)   Resp (!) 31   SpO2 93%   Physical Exam  Constitutional: She is oriented to person, place, and time. Vital signs are normal. No distress.  HENT:  Head: Normocephalic and atraumatic.  Mouth/Throat: Oropharynx is clear and moist and mucous membranes are normal.  Eyes: Conjunctivae and EOM are normal. Pupils are equal, round, and reactive to light.  Neck: Normal range of motion. Neck supple.  Cardiovascular: Normal rate, regular rhythm, S1 normal, S2 normal, normal heart sounds, intact distal pulses and normal pulses.  Exam reveals no gallop and no friction rub.   No murmur heard. Pulmonary/Chest: Effort normal. She has decreased breath sounds in the right lower field and the left lower field. She has no rhonchi.  Abdominal: Soft. She exhibits no distension. There is no tenderness.  Musculoskeletal: She exhibits no tenderness or deformity.  2+ b/l LE pitting edema to the knee. R arm in sling.   Neurological: She is alert and oriented to person, place, and time. GCS eye subscore is 4. GCS verbal subscore is 5. GCS motor subscore is 6.  Skin: Skin is warm. Capillary refill takes less than 2 seconds.  Nursing note and vitals reviewed.    ED Treatments / Results  Labs (all labs ordered are listed, but only abnormal results are displayed) Labs Reviewed  BASIC METABOLIC PANEL - Abnormal; Notable for the following:       Result Value   Sodium 133 (*)    Chloride 97 (*)    Glucose, Bld 103 (*)    GFR calc non Af  Amer 50 (*)    GFR calc Af Amer 58 (*)    All other components within normal limits  CBC - Abnormal; Notable for the following:    WBC 11.4 (*)    Hemoglobin 11.4 (*)    RDW 17.5 (*)    All other components within normal limits  CBC  HEPARIN LEVEL (UNFRACTIONATED)  I-STAT TROPOININ, ED  I-STAT TROPOININ, ED    EKG  EKG Interpretation  Date/Time:  Saturday September 27 2016 15:30:50 EST Ventricular Rate:  78 PR Interval:    QRS Duration: 131 QT Interval:  399 QTC Calculation: 452 R Axis:   76 Text Interpretation:  Sinus or ectopic atrial rhythm Right bundle branch block Confirmed by Reather Converse MD, Vonna Kotyk [phone]) on 09/27/2016 4:18:08 PM       Radiology Dg Chest 2 View  Result Date: 09/27/2016 CLINICAL DATA:  Chest pain for 1 week.  Hypertension. EXAM: CHEST  2 VIEW COMPARISON:  08/16/2016 FINDINGS: Heart size remains at the upper limits of normal. Aortic atherosclerosis. New small bilateral pleural effusions are seen as well as mild bibasilar atelectasis. No evidence of pulmonary edema or pneumothorax. Comminuted right humeral neck fracture is seen, with partial healing which is increased since previous study. IMPRESSION: New small bilateral pleural effusions and bibasilar atelectasis. Subacute, healing right humeral neck fracture. Electronically Signed   By: Earle Gell M.D.   On: 09/27/2016 16:44   Ct Angio Chest Pe W And/or Wo Contrast  Result Date: 09/27/2016 CLINICAL DATA:  Pleuritic chest pain and new pleural effusion EXAM: CT ANGIOGRAPHY CHEST WITH CONTRAST TECHNIQUE: Multidetector CT imaging of the chest was performed using the standard protocol during bolus administration of intravenous contrast. Multiplanar CT image reconstructions and MIPs were obtained to evaluate the vascular anatomy. CONTRAST:  80 mL Isovue 370 intravenous COMPARISON:  Chest x-ray 09/27/2016 FINDINGS: Cardiovascular: Satisfactory opacification of the pulmonary arteries to the segmental level. Nonocclusive  filling defect is visualized within a right middle lobe segmental pulmonary artery, series 5 image number 126. Additional small nonocclusive filling defect is present within a segmental left lower lobe pulmonary artery, series 5, image number 144. Estimated RV LV ratio is elevated at 1.3. Mild ectasia of the ascending aorta up to 3.7 cm. Atherosclerotic calcifications. No dissection. There are coronary artery calcifications. Mild cardiomegaly. Trace pericardial effusion. Mediastinum/Nodes: Hypodense nodules in the thyroid gland, the largest is seen at the isthmus and measures 1.6 cm. Trachea is midline. The esophagus is grossly unremarkable. No grossly enlarged hilar or mediastinal nodes. Lungs/Pleura: Moderate bilateral pleural effusions. Atelectasis or pneumonia within the bilateral lower lobes. Tiny nodules or nodular densities are present within the apex of the left upper lobe. No pneumothorax. Upper Abdomen: 9 mm left adrenal gland adenoma. Fatty atrophy of the pancreas. Musculoskeletal: Partially visualized comminuted fracture of the right humeral neck. Subacute right eighth and seventh rib fractures. Mild compression fracture of T2. Review of the MIP images confirms the above findings. IMPRESSION: 1. Findings consistent with acute nonocclusive small pulmonary emboli within right middle lobe and left lower lobe segmental pulmonary arterial branches. Positive for acute PE with CT evidence of right heart strain (RV/LV Ratio = 1.3) consistent with at least submassive (intermediate risk) PE. The presence of right heart strain has been associated with an increased risk of morbidity and mortality. Please activate Code PE by paging [phone]. 2. Moderate bilateral pleural effusions. Partial consolidations in the lower lobes could reflect atelectasis or pneumonia. Multiple nodules or nodular foci of inflammation within the left lung apex. 3. 9 mm left adrenal gland adenoma 4. Partially visualized comminuted  fracture of the right humeral neck. Mild compression of T2. Subacute right seventh and eighth rib fractures. Critical Value/emergent results were called by telephone at the time of interpretation on 09/27/2016 at 7:51 pm to Dr. Ovid Curd Kim Lauver , who verbally acknowledged these results. Electronically Signed   By: Donavan Foil M.D.   On: 09/27/2016 19:51    Procedures Procedures (including critical care time)  Medications Ordered in ED Medications  heparin ADULT infusion 100 units/mL (25000 units/231mL sodium chloride 0.45%) (950 Units/hr Intravenous New Bag/Given 09/27/16 2113)  iopamidol (ISOVUE-370) 76 % injection (100 mLs  Contrast Given 09/27/16 1826)  heparin bolus via infusion 3,000 Units (3,000 Units Intravenous Bolus from Bag 09/27/16 2113)     Initial Impression / Assessment and Plan /  ED Course  I have reviewed the triage vital signs and the nursing notes.  Pertinent labs & imaging results that were available during my care of the patient were reviewed by me and considered in my medical decision making (see chart for details).    89yoF with hx of HTN, HLD, bladder cancer with resection in 1993 presenting with 2 weeks of worsening pleuritic sharp chest pain, nonexertional, present at rest, on present on deep inspiration.  EKG with atrial arrythemia with RBBB.   CXR with new b/l pleural effusions and bibasilar atelectasis and healing R humeral neck fx present on admission. Initial troponin 0. CBC unremarkable. BMP grossly unremarkable with a creatinine 0.89 and a BUN of 12. Due to new pleural effusions and history concerning for pulmonary embolism, CT angiogram PE study performed.  CT with acute nonocclusive small pulmonary embolism in the right middle lobe and left lower lobe with CT evidence of right heart strain, moderate bilateral pleural effusions with no loculations however partial consolidations and lower lobes which could reflect pneumonia. Patient will be started on heparin for  PE and patient will be admitted to the hospitalist service for further management of PE. Pneumonia treatment deferred to the inpatient team as she has not had sputum production or fevers. Patient and family updated on the plan and are amenable to admission. Per the patient and her family she is a DNR/DNI at this time.  Patient care discussed and supervised by my attending, Dr. Reather Converse. Drucie Ip, MD   Final Clinical Impressions(s) / ED Diagnoses   Final diagnoses:  PE (pulmonary thromboembolism) [facility])    New Prescriptions New Prescriptions   No medications on file     Jameon Deller [place] Taliana Mersereau, MD 09/27/16 2248    Elnora Morrison, MD 09/28/16 [id number]

## 2016-09-27 NOTE — Progress Notes (Signed)
ANTICOAGULATION CONSULT NOTE - Initial Consult  Pharmacy Consult for heparin Indication: PE  Allergies  Allergen Reactions  . Tape Other (See Comments)    SKIN IS VERY THIN; TAPE WILL TEAR AND BRUISE THE PATIENT'S SKIN!!  Doreatha Massed [Cephalexin] Rash  . Sulfa Antibiotics Rash  . Sulfur Rash    Patient Measurements:   Heparin Dosing Weight: 63 kg  Vital Signs: Temp: 97.9 F (36.6 C) (01/20 1532) Temp Source: Oral (01/20 1532) BP: 137/62 (01/20 1930) Pulse Rate: 88 (01/20 1930)  Labs:  Recent Labs  09/27/16 1546  HGB 11.4*  HCT 36.6  PLT 322  CREATININE 0.89    CrCl cannot be calculated (Unknown ideal weight.).   Medical History: Past Medical History:  Diagnosis Date  . Cancer [facility])    Bladder  . Hiatal hernia   . High cholesterol   . Hypertension   . Sinus drainage      Assessment: Heparin gtt for acute PE with right heart strain CBC stable, renal fx wnl   Goal of Therapy:  Heparin level 0.3-0.7 units/ml Monitor platelets by anticoagulation protocol: Yes    Plan:  -Heparin bolus 3000 units x1 then 950 units/hr -Daily HL, CBC -First level with AM labs   Harvel Quale 09/27/2016,8:21 PM

## 2016-09-27 NOTE — H&P (Signed)
Kanoe Oland [id number] DOB: 03-09-1911 DOA: 09/27/2016     PCP: Leonard Downing, MD   Outpatient Specialists: [place]  Patient coming from:    home Lives alone,   Chief Complaint: chest pain  HPI: Dejai Ruebush is a 83 y.o. female with medical history significant of HTN,     Presented with two-week history of  intermittent chest pain associated some shortness of breath chest pain was severe feeling neck supple's ripping apart in the center of her chest currently resolved. [place] with taking deep breaths. Does not radiate. Patient had had some low extremity edema she have not been ambulating well ever since her fall. She denies any abdominal pain no nausea vomiting no fevers or chills she has no history of CVA or MI Patient used to have hypertension but her medications were discontinued because of low blood blood pressure last months.   Denies any fever but had mild cough.    Of note in December patient has had a fall fractured her right humeral head she have had some swelling of arm and presented to emergency department at that time Dopplers of her right arm show no evidence of DVT. Patient had her rehabilitation stay and was discharged to home about a week ago.     IN ER:  Temp (24hrs), Avg:97.9 F (36.6 C), Min:97.9 F (36.6 C), Max:97.9 F (36.6 C)     RR 31 93% HR 80 Bp 167/71 Troponin 0.00 sodium 133 and cr 0.89 WBC 11.4   CT chest showing acute nonocclusive small pulmonary emboli with right middle lobe and left lower lobe segmental arterial branches moderate bilateral pleural effusions partial consolidation in the lower lobes could be atelectasis versus pneumonia as well as subacute right seventh and eighth rib fractures.   Following Medications were ordered in ER: Medications  heparin ADULT infusion 100 units/mL (25000 units/250mL sodium chloride 0.45%) (950 Units/hr Intravenous New Bag/Given 09/27/16 2113)  iopamidol (ISOVUE-370) 76 % injection (100 mLs   Contrast Given 09/27/16 1826)  heparin bolus via infusion 3,000 Units (3,000 Units Intravenous Bolus from Bag 09/27/16 2113)       Hospitalist was called for admission for Pulmonary embolism  Review of Systems:    Pertinent positives include: shortness of breath at rest. chest pain,   Constitutional:  No weight loss, night sweats, Fevers, chills, fatigue, weight loss  HEENT:  No headaches, Difficulty swallowing,Tooth/dental problems,Sore throat,  No sneezing, itching, ear ache, nasal congestion, post nasal drip,  Cardio-vascular:  No Orthopnea, PND, anasarca, dizziness, palpitations.no Bilateral lower extremity swelling  GI:  No heartburn, indigestion, abdominal pain, nausea, vomiting, diarrhea, change in bowel habits, loss of appetite, melena, blood in stool, hematemesis Resp:  no No dyspnea on exertion, No excess mucus, no productive cough, No non-productive cough, No coughing up of blood.No change in color of mucus.No wheezing. Skin:  no rash or lesions. No jaundice GU:  no dysuria, change in color of urine, no urgency or frequency. No straining to urinate.  No flank pain.  Musculoskeletal:  No joint pain or no joint swelling. No decreased range of motion. No back pain.  Psych:  No change in mood or affect. No depression or anxiety. No memory loss.  Neuro: no localizing neurological complaints, no tingling, no weakness, no double vision, no gait abnormality, no slurred speech, no confusion  As per HPI otherwise 10 point review of systems negative.   Past Medical History: Past Medical History:  Diagnosis Date  . Cancer ([place])  Bladder  . Hiatal hernia   . High cholesterol   . Hypertension   . Sinus drainage    Past Surgical History:  Procedure Laterality Date  . BLADDER REMOVAL  Nov. 1993   part  . DILATION AND CURETTAGE OF UTERUS     50+ years     Social History:  Ambulatory  cane,      reports that she has quit smoking. Her smokeless tobacco use includes  Snuff. She reports that she does not drink alcohol. Her drug history is not on file.  Allergies:   Allergies  Allergen Reactions  . Tape Other (See Comments)    SKIN IS VERY THIN; TAPE WILL TEAR AND BRUISE THE PATIENT'S SKIN!!  Doreatha Massed [Cephalexin] Rash  . Sulfa Antibiotics Rash  . Sulfur Rash       Family History:   Family History  Problem Relation Age of Onset  . Cancer Father   . Cancer Sister   . Cancer Brother     Medications: Prior to Admission medications   Medication Sig Start Date End Date Taking? Authorizing Provider  acetaminophen (TYLENOL) 500 MG tablet Take 2 tablets (1,000 mg total) by mouth 3 (three) times daily before meals. Patient taking differently: Take 1,000 mg by mouth 3 (three) times daily as needed for mild pain.  08/17/16  Yes Robbie Lis, MD  alendronate (FOSAMAX) 70 MG tablet Take 70 mg by mouth every 7 (seven) days. Take usually on Mondays or Tuesdays 07/25/16  Yes Historical Provider, MD  aspirin EC 81 MG tablet Take 81 mg by mouth daily.   Yes Historical Provider, MD  Calcium Carbonate (CALCIUM 600 PO) Take 1 tablet by mouth daily.    Yes Historical Provider, MD  Cholecalciferol (VITAMIN D-3) 1000 units CAPS Take 1 capsule by mouth daily.   Yes Historical Provider, MD  furosemide (LASIX) 80 MG tablet Take 80 mg by mouth daily as needed for fluid.   Yes Historical Provider, MD  naphazoline-pheniramine (NAPHCON-A) 0.025-0.3 % ophthalmic solution Place 1 drop into both eyes 4 (four) times daily as needed for irritation or allergies.    Yes Historical Provider, MD  omega-3 acid ethyl esters (LOVAZA) 1 g capsule Take 1 g by mouth daily.   Yes Historical Provider, MD  OVER THE COUNTER MEDICATION Take 1 capsule by mouth daily. arteclear Circulation 2   Yes Historical Provider, MD  triamcinolone cream (KENALOG) 0.1 % Apply 1 application topically daily as needed (rash). TO LEGS 07/28/16  Yes Historical Provider, MD    Physical Exam: Patient Vitals for  the past 24 hrs:  BP Temp Temp src Pulse Resp SpO2  09/27/16 2100 167/71 - - 80 (!) 31 93 %  09/27/16 2030 136/63 - - 76 21 93 %  09/27/16 2000 141/69 - - 76 24 95 %  09/27/16 1930 137/62 - - 88 22 94 %  09/27/16 1915 144/76 - - 87 - 94 %  09/27/16 1900 143/69 - - 82 - 94 %  09/27/16 1815 120/56 - - - - -  09/27/16 1730 123/59 - - - - -  09/27/16 1700 115/55 - - - - -  09/27/16 1630 120/56 - - - - -  09/27/16 1546 143/64 - - 79 - 95 %  09/27/16 1533 - - - - - 96 %  09/27/16 1532 140/60 97.9 F (36.6 C) Oral - (!) 27 -    1. General:  in No Acute distress, hard of hearing 2. Psychological: Alert  and Oriented 3. Head/ENT:    Dry Mucous Membranes                          Head Non traumatic, neck supple                            Poor Dentition 4. SKIN:  decreased Skin turgor,  Skin clean Dry and intact no rash 5. Heart: Regular rate and rhythm no  Murmur, Rub or gallop 6. Lungs:Diminished at the bases no wheezes some crackles   7. Abdomen: Soft,  non-tender, Non distended 8. Lower extremities: no clubbing, cyanosis, 2+ Weeping edema right worse on left. Right upper extremity in a sling 9. Neurologically Grossly intact, moving all 4 extremities equally   10. MSK: Normal range of motion   body mass index is unknown because there is no height or weight on file.  Labs on Admission:   Labs on Admission: I have personally reviewed following labs and imaging studies  CBC:  Recent Labs Lab 09/27/16 1546  WBC 11.4*  HGB 11.4*  HCT 36.6  MCV 84.7  PLT [id number]   Basic Metabolic Panel:  Recent Labs Lab 09/27/16 1546  NA 133*  K 4.1  CL 97*  CO2 25  GLUCOSE 103*  BUN 12  CREATININE 0.89  CALCIUM 9.3   GFR: CrCl cannot be calculated (Unknown ideal weight.). Liver Function Tests: No results for input(s): AST, ALT, ALKPHOS, BILITOT, PROT, ALBUMIN in the last 168 hours. No results for input(s): LIPASE, AMYLASE in the last 168 hours. No results for input(s): AMMONIA in the  last 168 hours. Coagulation Profile: No results for input(s): INR, PROTIME in the last 168 hours. Cardiac Enzymes: No results for input(s): CKTOTAL, CKMB, CKMBINDEX, TROPONINI in the last 168 hours. BNP (last 3 results) No results for input(s): PROBNP in the last 8760 hours. HbA1C: No results for input(s): HGBA1C in the last 72 hours. CBG: No results for input(s): GLUCAP in the last 168 hours. Lipid Profile: No results for input(s): CHOL, HDL, LDLCALC, TRIG, CHOLHDL, LDLDIRECT in the last 72 hours. Thyroid Function Tests: No results for input(s): TSH, T4TOTAL, FREET4, T3FREE, THYROIDAB in the last 72 hours. Anemia Panel: No results for input(s): VITAMINB12, FOLATE, FERRITIN, TIBC, IRON, RETICCTPCT in the last 72 hours.  Sepsis Labs: @LABRCNTIP (procalcitonin:4,lacticidven:4) )No results found for this or any previous visit (from the past 240 hour(s)).     UA  not ordered  No results found for: HGBA1C  CrCl cannot be calculated (Unknown ideal weight.).  BNP (last 3 results) No results for input(s): PROBNP in the last 8760 hours.   ECG REPORT  Independently reviewed Rate: 74  Rhythm: RBBB ST&T Change: No acute ischemic changes   QTC 452  There were no vitals filed for this visit.   Cultures:    Component Value Date/Time   SDES URINE, CLEAN CATCH 07/22/2015 1210   SPECREQUEST NONE 07/22/2015 1210   CULT  07/22/2015 1210    9,000 COLONIES/mL INSIGNIFICANT GROWTH Performed at [place] 07/23/2015 FINAL 07/22/2015 1210     Radiological Exams on Admission: Dg Chest 2 View  Result Date: 09/27/2016 CLINICAL DATA:  Chest pain for 1 week.  Hypertension. EXAM: CHEST  2 VIEW COMPARISON:  08/16/2016 FINDINGS: Heart size remains at the upper limits of normal. Aortic atherosclerosis. New small bilateral pleural effusions are seen as well as mild bibasilar atelectasis.  No evidence of pulmonary edema or pneumothorax. Comminuted right humeral neck fracture  is seen, with partial healing which is increased since previous study. IMPRESSION: New small bilateral pleural effusions and bibasilar atelectasis. Subacute, healing right humeral neck fracture. Electronically Signed   By: Earle Gell M.D.   On: 09/27/2016 16:44   Ct Angio Chest Pe W And/or Wo Contrast  Result Date: 09/27/2016 CLINICAL DATA:  Pleuritic chest pain and new pleural effusion EXAM: CT ANGIOGRAPHY CHEST WITH CONTRAST TECHNIQUE: Multidetector CT imaging of the chest was performed using the standard protocol during bolus administration of intravenous contrast. Multiplanar CT image reconstructions and MIPs were obtained to evaluate the vascular anatomy. CONTRAST:  80 mL Isovue 370 intravenous COMPARISON:  Chest x-ray 09/27/2016 FINDINGS: Cardiovascular: Satisfactory opacification of the pulmonary arteries to the segmental level. Nonocclusive filling defect is visualized within a right middle lobe segmental pulmonary artery, series 5 image number 126. Additional small nonocclusive filling defect is present within a segmental left lower lobe pulmonary artery, series 5, image number 144. Estimated RV LV ratio is elevated at 1.3. Mild ectasia of the ascending aorta up to 3.7 cm. Atherosclerotic calcifications. No dissection. There are coronary artery calcifications. Mild cardiomegaly. Trace pericardial effusion. Mediastinum/Nodes: Hypodense nodules in the thyroid gland, the largest is seen at the isthmus and measures 1.6 cm. Trachea is midline. The esophagus is grossly unremarkable. No grossly enlarged hilar or mediastinal nodes. Lungs/Pleura: Moderate bilateral pleural effusions. Atelectasis or pneumonia within the bilateral lower lobes. Tiny nodules or nodular densities are present within the apex of the left upper lobe. No pneumothorax. Upper Abdomen: 9 mm left adrenal gland adenoma. Fatty atrophy of the pancreas. Musculoskeletal: Partially visualized comminuted fracture of the right humeral neck.  Subacute right eighth and seventh rib fractures. Mild compression fracture of T2. Review of the MIP images confirms the above findings. IMPRESSION: 1. Findings consistent with acute nonocclusive small pulmonary emboli within right middle lobe and left lower lobe segmental pulmonary arterial branches. Positive for acute PE with CT evidence of right heart strain (RV/LV Ratio = 1.3) consistent with at least submassive (intermediate risk) PE. The presence of right heart strain has been associated with an increased risk of morbidity and mortality. Please activate Code PE by paging [phone]. 2. Moderate bilateral pleural effusions. Partial consolidations in the lower lobes could reflect atelectasis or pneumonia. Multiple nodules or nodular foci of inflammation within the left lung apex. 3. 9 mm left adrenal gland adenoma 4. Partially visualized comminuted fracture of the right humeral neck. Mild compression of T2. Subacute right seventh and eighth rib fractures. Critical Value/emergent results were called by telephone at the time of interpretation on 09/27/2016 at 7:51 pm to Dr. Ovid Curd PAGE , who verbally acknowledged these results. Electronically Signed   By: Donavan Foil M.D.   On: 09/27/2016 19:51    Chart has been reviewed    Assessment/Plan  83 y.o. female with medical history significant of HTN, admitted for PE and possible HCAP     Present on Admission: . Essential hypertension patient is no longer taking medications at home given intermittent hypotension will hold off on any further antihypertensive medications for now . Pulmonary embolism ([place]) - Admit to   telemetry   Initiate heparin drip  Would likely benefit from case manager consult for long term anticoagulation Hold home blood pressure medications avoid hypotension Cycle cardiac enzymes Order echogram and lower extremity Dopplers  Most likely risk factors for hypercoagulable state being   sedentary lifestyle     .  HCAP  (healthcare-associated pneumonia) given recent stay in rehabilitation facility slightly elevated white blood cell count and cough will cover for healthcare acquired pneumonia and monitor clinical status   Other plan as per orders.  DVT prophylaxis: heparin    Code Status:   DNR/DNI   as per patient    Family Communication:   Family  at  Bedside  plan of care was discussed with  Son, Daughter, grandson  Disposition Plan:   To home once workup is complete and patient is stable                        Would benefit from PT/OT eval prior to DC please order prior to discharge once patient has been on anticoagulation for 24 hours is no evidence of large DVT                       Will likely need to have home health continued at the time of discharge                          Consults called: none    Admission status:  inpatient   Level of care   tele        I have spent a total of 57 min on this admission  Jaclyne Haverstick 09/27/2016, 10:40 PM    [facility]  Pager [phone]   after 2 AM please page floor coverage PA If 7AM-7PM, please contact the day team taking care of the patient  [email or web address]  Password [email or web address]

## 2016-09-28 ENCOUNTER — Inpatient Hospital Stay (HOSPITAL_COMMUNITY): Payer: Medicare Other

## 2016-09-28 DIAGNOSIS — I2699 Other pulmonary embolism without acute cor pulmonale: Principal | ICD-10-CM

## 2016-09-28 DIAGNOSIS — J189 Pneumonia, unspecified organism: Secondary | ICD-10-CM

## 2016-09-28 DIAGNOSIS — D72828 Other elevated white blood cell count: Secondary | ICD-10-CM

## 2016-09-28 DIAGNOSIS — E876 Hypokalemia: Secondary | ICD-10-CM

## 2016-09-28 LAB — ECHOCARDIOGRAM COMPLETE
AOVTI: 45.5 cm
AV Area mean vel: 1.15 cm2
AV VEL mean LVOT/AV: 0.57
AV area mean vel ind: 0.66 cm2/m2
AV vel: 1.24
AVA: 1.24 cm2
AVAREAVTI: 1.15 cm2
AVAREAVTIIND: 0.71 cm2/m2
AVG: 12 mmHg
AVLVOTPG: 6 mmHg
AVPG: 20 mmHg
AVPKVEL: 222 cm/s
Ao pk vel: 0.57 m/s
CHL CUP AV PEAK INDEX: 0.66
CHL CUP AV VALUE AREA INDEX: 0.71
CHL CUP DOP CALC LVOT VTI: 28.1 cm
CHL CUP MV DEC (S): 306
DOP CAL AO MEAN VELOCITY: 165 cm/s
EERAT: 22.16
EWDT: 306 ms
FS: 27 % — AB (ref 28–44)
Height: 62 in
IV/PV OW: 1.06
LA ID, A-P, ES: 30 mm
LA diam end sys: 30 mm
LA diam index: 1.72 cm/m2
LA vol A4C: 41.6 ml
LA vol index: 27.2 mL/m2
LAVOL: 47.4 mL
LV PW d: 8.45 mm — AB (ref 0.6–1.1)
LV TDI E'LATERAL: 5.46
LV TDI E'MEDIAL: 5.35
LV e' LATERAL: 5.46 cm/s
LVEEAVG: 22.16
LVEEMED: 22.16
LVOT area: 2.01 cm2
LVOT peak VTI: 0.62 cm
LVOT peak vel: 127 cm/s
LVOTD: 16 mm
LVOTSV: 56 mL
Lateral S' vel: 7.03 cm/s
MV Peak grad: 6 mmHg
MV pk E vel: 121 m/s
MVPKAVEL: 111 m/s
RV sys press: 42 mmHg
Reg peak vel: 314 cm/s
TR max vel: 314 cm/s
Weight: 2568 oz

## 2016-09-28 LAB — COMPREHENSIVE METABOLIC PANEL
ALK PHOS: 83 U/L (ref 38–126)
ALT: 11 U/L — AB (ref 14–54)
AST: 20 U/L (ref 15–41)
Albumin: 2.2 g/dL — ABNORMAL LOW (ref 3.5–5.0)
Anion gap: 7 (ref 5–15)
BUN: 11 mg/dL (ref 6–20)
CALCIUM: 8.9 mg/dL (ref 8.9–10.3)
CO2: 26 mmol/L (ref 22–32)
CREATININE: 0.8 mg/dL (ref 0.44–1.00)
Chloride: 101 mmol/L (ref 101–111)
GFR calc Af Amer: >60 mL/min (ref >60)
GFR calc non Af Amer: 57 mL/min — ABNORMAL LOW (ref >60)
GLUCOSE: 124 mg/dL — AB (ref 65–99)
Potassium: 3.4 mmol/L — ABNORMAL LOW (ref 3.5–5.1)
SODIUM: 134 mmol/L — AB (ref 135–145)
Total Bilirubin: 0.8 mg/dL (ref 0.3–1.2)
Total Protein: 5.3 g/dL — ABNORMAL LOW (ref 6.5–8.1)

## 2016-09-28 LAB — CBC
HEMATOCRIT: 36.2 % (ref 36.0–46.0)
HEMOGLOBIN: 11.1 g/dL — AB (ref 12.0–15.0)
MCH: 25.9 pg — ABNORMAL LOW (ref 26.0–34.0)
MCHC: 30.7 g/dL (ref 30.0–36.0)
MCV: 84.6 fL (ref 78.0–100.0)
Platelets: 315 10*3/uL (ref 150–400)
RBC: 4.28 MIL/uL (ref 3.87–5.11)
RDW: 17.5 % — ABNORMAL HIGH (ref 11.5–15.5)
WBC: 9.2 10*3/uL (ref 4.0–10.5)

## 2016-09-28 LAB — HEPARIN LEVEL (UNFRACTIONATED)
HEPARIN UNFRACTIONATED: 0.12 [IU]/mL — AB (ref 0.30–0.70)
HEPARIN UNFRACTIONATED: 0.24 [IU]/mL — AB (ref 0.30–0.70)

## 2016-09-28 LAB — MAGNESIUM: Magnesium: 2 mg/dL (ref 1.7–2.4)

## 2016-09-28 LAB — TSH: TSH: 1.893 u[IU]/mL (ref 0.350–4.500)

## 2016-09-28 LAB — PHOSPHORUS: Phosphorus: 2.8 mg/dL (ref 2.5–4.6)

## 2016-09-28 MED ORDER — HEPARIN BOLUS VIA INFUSION
1000.0000 [IU] | Freq: Once | INTRAVENOUS | Status: AC
  Administered 2016-09-28: 1000 [IU] via INTRAVENOUS
  Filled 2016-09-28: qty 1000

## 2016-09-28 MED ORDER — POTASSIUM CHLORIDE CRYS ER 20 MEQ PO TBCR
40.0000 meq | EXTENDED_RELEASE_TABLET | Freq: Once | ORAL | Status: AC
  Administered 2016-09-28: 40 meq via ORAL
  Filled 2016-09-28: qty 2

## 2016-09-28 MED ORDER — VANCOMYCIN HCL IN DEXTROSE 750-5 MG/150ML-% IV SOLN
750.0000 mg | INTRAVENOUS | Status: DC
  Administered 2016-09-28 – 2016-09-30 (×3): 750 mg via INTRAVENOUS
  Filled 2016-09-28 (×3): qty 150

## 2016-09-28 MED ORDER — HEPARIN BOLUS VIA INFUSION
2000.0000 [IU] | Freq: Once | INTRAVENOUS | Status: AC
  Administered 2016-09-28: 2000 [IU] via INTRAVENOUS
  Filled 2016-09-28: qty 2000

## 2016-09-28 NOTE — Progress Notes (Addendum)
Patient ID: Savannah Parks, female   DOB: [birth year], 83 y.o.   MRN: [id number]  PROGRESS NOTE    Janit Cutter  TGG:[id number] DOB: 05-Nov-1910 DOA: 09/27/2016  PCP: Leonard Downing, MD   Brief Narrative:  83 y.o. female with medical history significant of HTN, hard of hearing, legally blind, recent hospitalization in 08/2016 for right proximal humeral fracture. She presented to [facility] this time with 2 weeks history of intermittent, non radiating, central chest pain associated with shortness of breath. No fevers and she does have mild cough intermittently productive of clear to whitish sputum. She was in rehab facility and discharged home about 1 week prior to this admission.   CT chest showed acute nonocclusive small pulmonary emboli with right middle lobe and left lower lobe segmental arterial branches, moderate bilateral pleural effusions, partial consolidation in the lower lobes could be atelectasis versus pneumonia as well as subacute right seventh and eighth rib fractures. She was started on heparin drip and azactam and vanco for HCAP.  Assessment & Plan:   Active Problems: Nonocclusive small pulmonary emboli within right middle lobe and left lower lobe segmental pulmonary arterial branches with right heart strain - Per CT scan, submassive PE - Continue heparin drip  Sepsis secondary to HCAP / Leukocytosis  - Sepsis criteria met on admission with leukocytosis, tachypnea and source of infection pneumonia  - Due to recent hospitalization in 08/2016 pt is being treated with empiric vanco and azactam for HCAP - CT showed lower lobe pneumonia - Blood cx not collected for some reason on admission  - Strep pneumonia and resp cx pending   Hypokalemia - Supplemented - WNL   DVT prophylaxis: Heparin drip  Code Status: DNR/DNI Family Communication: no family at the bedside this am Disposition Plan: home or SNF depending on PT eval, likely bu 1/23   Consultants:    PT  Procedures:   ECHO  Antimicrobials:   Azactam 09/27/2016 -->  Vanco 09/27/2016 -->   Subjective: No overnight events. Says she feels better after she had some food.   Objective: Vitals:   09/27/16 2300 09/28/16 0005 09/28/16 0018 09/28/16 0455  BP: 175/76  (!) 146/69 (!) 135/57  Pulse:   82 86  Resp: (!) 28  (!) 28 (!) 26  Temp:   98 F (36.7 C) 97.6 F (36.4 C)  TempSrc:   Oral Oral  SpO2:   95% 96%  Weight:  72.8 kg (160 lb 8 oz)    Height:  '5\' 2"'  (1.575 m)      Intake/Output Summary (Last 24 hours) at 09/28/16 1147 Last data filed at 09/28/16 1039  Gross per 24 hour  Intake            391.2 ml  Output                0 ml  Net            391.2 ml   Filed Weights   09/28/16 0005  Weight: 72.8 kg (160 lb 8 oz)    Examination:  General exam: Appears calm and comfortable  Respiratory system: Diminished breath sounds bilaterally, no wheezing, (+) congested  Cardiovascular system: S1 & S2 heard, Rate controlled  Gastrointestinal system: Abdomen is nondistended, soft and nontender. No organomegaly or masses felt. Normal bowel sounds heard. Central nervous system: No focal neurological deficits. Extremities: Symmetric 5 x 5 power. Skin: No rashes, lesions or ulcers Psychiatry: Judgement and insight appear normal. Mood & affect appropriate.   Data  Reviewed: I have personally reviewed following labs and imaging studies  CBC:  Recent Labs Lab 09/27/16 1546 09/28/16 0324  WBC 11.4* 9.2  HGB 11.4* 11.1*  HCT 36.6 36.2  MCV 84.7 84.6  PLT 322 707   Basic Metabolic Panel:  Recent Labs Lab 09/27/16 1546 09/28/16 0324  NA 133* 134*  K 4.1 3.4*  CL 97* 101  CO2 25 26  GLUCOSE 103* 124*  BUN 12 11  CREATININE 0.89 0.80  CALCIUM 9.3 8.9  MG  --  2.0  PHOS  --  2.8   GFR: Estimated Creatinine Clearance: 32.3 mL/min (by C-G formula based on SCr of 0.8 mg/dL). Liver Function Tests:  Recent Labs Lab 09/28/16 0324  AST 20  ALT 11*  ALKPHOS  83  BILITOT 0.8  PROT 5.3*  ALBUMIN 2.2*   No results for input(s): LIPASE, AMYLASE in the last 168 hours. No results for input(s): AMMONIA in the last 168 hours. Coagulation Profile: No results for input(s): INR, PROTIME in the last 168 hours. Cardiac Enzymes: No results for input(s): CKTOTAL, CKMB, CKMBINDEX, TROPONINI in the last 168 hours. BNP (last 3 results) No results for input(s): PROBNP in the last 8760 hours. HbA1C: No results for input(s): HGBA1C in the last 72 hours. CBG: No results for input(s): GLUCAP in the last 168 hours. Lipid Profile: No results for input(s): CHOL, HDL, LDLCALC, TRIG, CHOLHDL, LDLDIRECT in the last 72 hours. Thyroid Function Tests:  Recent Labs  09/28/16 0324  TSH 1.893   Anemia Panel: No results for input(s): VITAMINB12, FOLATE, FERRITIN, TIBC, IRON, RETICCTPCT in the last 72 hours. Urine analysis:    Component Value Date/Time   COLORURINE YELLOW 07/22/2015 1210   APPEARANCEUR CLOUDY (A) 07/22/2015 1210   LABSPEC 1.019 07/22/2015 1210   PHURINE 6.5 07/22/2015 1210   GLUCOSEU NEGATIVE 07/22/2015 1210   HGBUR NEGATIVE 07/22/2015 1210   BILIRUBINUR NEGATIVE 07/22/2015 1210   KETONESUR NEGATIVE 07/22/2015 1210   PROTEINUR NEGATIVE 07/22/2015 1210   UROBILINOGEN 1.0 07/22/2015 1210   NITRITE NEGATIVE 07/22/2015 1210   LEUKOCYTESUR TRACE (A) 07/22/2015 1210   Sepsis Labs: '@LABRCNTIP' (procalcitonin:4,lacticidven:4)   )No results found for this or any previous visit (from the past 240 hour(s)).    Radiology Studies: Dg Chest 2 View Result Date: 09/27/2016 New small bilateral pleural effusions and bibasilar atelectasis. Subacute, healing right humeral neck fracture.  Ct Angio Chest Pe W And/or Wo Contrast Result Date: 09/27/2016 1. Findings consistent with acute nonocclusive small pulmonary emboli within right middle lobe and left lower lobe segmental pulmonary arterial branches. Positive for acute PE with CT evidence of right heart  strain (RV/LV Ratio = 1.3) consistent with at least submassive (intermediate risk) PE. The presence of right heart strain has been associated with an increased risk of morbidity and mortality. Please activate Code PE by paging [phone]. 2. Moderate bilateral pleural effusions. Partial consolidations in the lower lobes could reflect atelectasis or pneumonia. Multiple nodules or nodular foci of inflammation within the left lung apex. 3. 9 mm left adrenal gland adenoma 4. Partially visualized comminuted fracture of the right humeral neck. Mild compression of T2. Subacute right seventh and eighth rib fractures.    Scheduled Meds: . aspirin EC  81 mg Oral Daily  . aztreonam  1 g Intravenous Q8H  . potassium chloride  40 mEq Oral Once  . vancomycin  750 mg Intravenous Q24H   Continuous Infusions: . heparin 1,100 Units/hr (09/28/16 0423)     LOS: 1 day  Time spent: 25 minutes  Greater than 50% of the time spent on counseling and coordinating the care.   Leisa Lenz, MD [facility] Pager [phone]  If 7PM-7AM, please contact night-coverage [email or web address] Password [facility] 09/28/2016, 11:47 AM

## 2016-09-28 NOTE — Progress Notes (Signed)
  Echocardiogram 2D Echocardiogram has been performed.  Johny Chess 09/28/2016, 9:59 AM

## 2016-09-28 NOTE — Progress Notes (Signed)
*  PRELIMINARY RESULTS* Vascular Ultrasound Bilateral lower extremity venous duplex has been completed.  Preliminary findings: No evidence of deep vein thrombosis in the visualized veins of the lower extremities. Negative for baker's cysts bilaterally.   Myrtie Cruise Orlene Salmons 09/28/2016, 10:01 AM

## 2016-09-28 NOTE — Progress Notes (Signed)
ANTICOAGULATION CONSULT NOTE - Follow Up Consult  Pharmacy Consult for Heparin  Indication: pulmonary embolus  Labs:  Recent Labs  09/27/16 1546 09/28/16 0324  HGB 11.4* 11.1*  HCT 36.6 36.2  PLT 322 315  HEPARINUNFRC  --  0.12*  CREATININE 0.89  --     Estimated Creatinine Clearance: 29.1 mL/min (by C-G formula based on SCr of 0.89 mg/dL).  Assessment: Heparin for new onset PE, initial heparin level is low, no issues per RN.   Goal of Therapy:  Heparin level 0.3-0.7 units/ml Monitor platelets by anticoagulation protocol: Yes   Plan:  Heparin 2000 units [employer] heparin drip to 1100 units/hr 1300 HL Daily CBC/HL Monitor for bleeding   Narda Bonds 09/28/2016,4:18 AM

## 2016-09-28 NOTE — Progress Notes (Signed)
ANTICOAGULATION CONSULT NOTE - Follow Up Consult  Pharmacy Consult for Heparin  Indication: pulmonary embolus  Labs:  Recent Labs  09/27/16 1546 09/28/16 0324 09/28/16 1255  HGB 11.4* 11.1*  --   HCT 36.6 36.2  --   PLT 322 315  --   HEPARINUNFRC  --  0.12* 0.24*  CREATININE 0.89 0.80  --     Estimated Creatinine Clearance: 32.3 mL/min (by C-G formula based on SCr of 0.8 mg/dL).  Assessment: Heparin for new onset PE; heparin level remains subtherapeutic despite bolus and rate increase. CBC stable; no bleeding or infusion related issues reported.  Heparin level subtherapeutic: 0.24   Goal of Therapy:  Heparin level 0.3-0.7 units/ml Monitor platelets by anticoagulation protocol: Yes   Plan:  Heparin 1000 units BOLUS Increase heparin gtt to 1250 units/hr Daily CBC/HL Monitor for bleeding  Georga Bora, PharmD Clinical Pharmacist Pager: [phone] 09/28/2016 2:42 PM

## 2016-09-28 NOTE — Progress Notes (Signed)
Pharmacy Antibiotic Note  Savannah Parks is a 83 y.o. female admitted on 09/27/2016 with pneumonia.  Pharmacy has been consulted for Vancomycin dosing. WBC mildly elevated. Renal function age appropriate. Pt also on heparin for acute PE.   Plan: -Vancomycin 750 mg IV q24h -Aztreonam per MD -Trend WBC, temp, renal function  -F/U infectious work-up -Drug levels as indicated  Temp (24hrs), Avg:97.9 F (36.6 C), Min:97.9 F (36.6 C), Max:97.9 F (36.6 C)   Recent Labs Lab 09/27/16 1546  WBC 11.4*  CREATININE 0.89    CrCl cannot be calculated (Unknown ideal weight.).    Allergies  Allergen Reactions  . Tape Other (See Comments)    SKIN IS VERY THIN; TAPE WILL TEAR AND BRUISE THE PATIENT'S SKIN!!  Doreatha Massed [Cephalexin] Rash  . Sulfa Antibiotics Rash  . Sulfur Rash    Narda Bonds 09/28/2016 12:06 AM

## 2016-09-29 DIAGNOSIS — I10 Essential (primary) hypertension: Secondary | ICD-10-CM

## 2016-09-29 LAB — BASIC METABOLIC PANEL
Anion gap: 8 (ref 5–15)
BUN: 11 mg/dL (ref 6–20)
CHLORIDE: 102 mmol/L (ref 101–111)
CO2: 24 mmol/L (ref 22–32)
Calcium: 8.9 mg/dL (ref 8.9–10.3)
Creatinine, Ser: 0.79 mg/dL (ref 0.44–1.00)
GFR calc Af Amer: >60 mL/min (ref >60)
GFR calc non Af Amer: >60 mL/min (ref >60)
GLUCOSE: 104 mg/dL — AB (ref 65–99)
Potassium: 3.8 mmol/L (ref 3.5–5.1)
Sodium: 134 mmol/L — ABNORMAL LOW (ref 135–145)

## 2016-09-29 LAB — CBC
HEMATOCRIT: 34.2 % — AB (ref 36.0–46.0)
HEMOGLOBIN: 10.4 g/dL — AB (ref 12.0–15.0)
MCH: 25.6 pg — AB (ref 26.0–34.0)
MCHC: 30.4 g/dL (ref 30.0–36.0)
MCV: 84 fL (ref 78.0–100.0)
Platelets: 355 10*3/uL (ref 150–400)
RBC: 4.07 MIL/uL (ref 3.87–5.11)
RDW: 17.4 % — ABNORMAL HIGH (ref 11.5–15.5)
WBC: 6.5 10*3/uL (ref 4.0–10.5)

## 2016-09-29 LAB — HEPARIN LEVEL (UNFRACTIONATED)
HEPARIN UNFRACTIONATED: 0.4 [IU]/mL (ref 0.30–0.70)
Heparin Unfractionated: 0.51 IU/mL (ref 0.30–0.70)

## 2016-09-29 NOTE — Progress Notes (Signed)
ANTICOAGULATION CONSULT NOTE - Follow Up Consult  Pharmacy Consult for Heparin  Indication: pulmonary embolus  Labs:  Recent Labs  09/27/16 1546  09/28/16 0324 09/28/16 1255 09/29/16 0941 09/29/16 1155 09/29/16 1638  HGB 11.4*  --  11.1*  --   --  10.4*  --   HCT 36.6  --  36.2  --   --  34.2*  --   PLT 322  --  315  --   --  355  --   HEPARINUNFRC  --   < > 0.12* 0.24* 0.51  --  0.40  CREATININE 0.89  --  0.80  --   --  0.79  --   < > = values in this interval not displayed.  Estimated Creatinine Clearance: 32.3 mL/min (by C-G formula based on SCr of 0.79 mg/dL).  Assessment: 17 you female with new onset PE. Heparin level is 0.4 and remains in goal range.   Goal of Therapy:  Heparin level 0.3-0.7 units/ml Monitor platelets by anticoagulation protocol: Yes   Plan:  - Continue IV heparin at 1250 units/hr - Daily HL and CBC  Savannah Parks S. Alford Highland, PharmD, [facility] Clinical Staff Pharmacist Pager [phone]   09/29/2016 5:44 PM

## 2016-09-29 NOTE — Progress Notes (Signed)
Patient ID: Kiran Lapine, female   DOB: 10-Oct-1910, 83 y.o.   MRN: [id number]  PROGRESS NOTE    Treniece Holsclaw  MOQ:[id number] DOB: Jan 23, 1911 DOA: 09/27/2016  PCP: Leonard Downing, MD   Brief Narrative:  83 y.o. female with medical history significant of HTN, hard of hearing, legally blind, recent hospitalization in 08/2016 for right proximal humeral fracture. She presented to [facility] this time with 2 weeks history of intermittent, non radiating, central chest pain associated with shortness of breath. No fevers and she does have mild cough intermittently productive of clear to whitish sputum. She was in rehab facility and discharged home about 1 week prior to this admission.   CT chest showed acute nonocclusive small pulmonary emboli with right middle lobe and left lower lobe segmental arterial branches, moderate bilateral pleural effusions, partial consolidation in the lower lobes could be atelectasis versus pneumonia as well as subacute right seventh and eighth rib fractures. She was started on heparin drip and azactam and vanco for HCAP.  Assessment & Plan:   Active Problems: Nonocclusive small pulmonary emboli within right middle lobe and left lower lobe segmental pulmonary arterial branches with right heart strain - Per CT scan, submassive PE - Continue heparin drip  Sepsis secondary to HCAP / Leukocytosis  - Sepsis criteria met on admission with leukocytosis, tachypnea and source of infection pneumonia  - CT showed lower lobe pneumonia - Due to recent hospitalization in 08/2016 pt is being treated with empiric vanco and azactam for presumed HCAP - Blood cx not collected for some reason on admission  - Strep pneumonia and resp cx pending as of this am  Hypokalemia - Supplemented - WNL   DVT prophylaxis: Heparin drip  Code Status: DNR/DNI Family Communication: no family at the bedside this am Disposition Plan: home or SNF depending on PT eval, likely bu  1/23   Consultants:   PT  Procedures:   ECHO  Antimicrobials:   Azactam 09/27/2016 -->  Vanco 09/27/2016 -->   Subjective: No overnight events.   Objective: Vitals:   09/28/16 0455 09/28/16 1344 09/28/16 2120 09/29/16 0627  BP: (!) 135/57 (!) 132/58 137/61 (!) 131/59  Pulse: 86 82 85 92  Resp: (!) 26     Temp: 97.6 F (36.4 C) 97.8 F (36.6 C) 98.2 F (36.8 C) 97.5 F (36.4 C)  TempSrc: Oral Oral Oral Oral  SpO2: 96% 94% 96% 93%  Weight:      Height:       No intake or output data in the 24 hours ending 09/29/16 1327 Filed Weights   09/28/16 0005  Weight: 72.8 kg (160 lb 8 oz)    Examination:  General exam: Appears calm and comfortable, no distress  Respiratory system: Diminished bilaterally, no wheezing Cardiovascular system: S1 & S2 heard, Rate controlled  Gastrointestinal system: (+) BS, non tender Central nervous system: No nfocal  Extremities: No edema, palpable pulses  Skin: No rashes, lesions or ulcers Psychiatry: Mood & affect appropriate.   Data Reviewed: I have personally reviewed following labs and imaging studies  CBC:  Recent Labs Lab 09/27/16 1546 09/28/16 0324 09/29/16 1155  WBC 11.4* 9.2 6.5  HGB 11.4* 11.1* 10.4*  HCT 36.6 36.2 34.2*  MCV 84.7 84.6 84.0  PLT 322 315 354   Basic Metabolic Panel:  Recent Labs Lab 09/27/16 1546 09/28/16 0324  NA 133* 134*  K 4.1 3.4*  CL 97* 101  CO2 25 26  GLUCOSE 103* 124*  BUN 12 11  CREATININE 0.89  0.80  CALCIUM 9.3 8.9  MG  --  2.0  PHOS  --  2.8   GFR: Estimated Creatinine Clearance: 32.3 mL/min (by C-G formula based on SCr of 0.8 mg/dL). Liver Function Tests:  Recent Labs Lab 09/28/16 0324  AST 20  ALT 11*  ALKPHOS 83  BILITOT 0.8  PROT 5.3*  ALBUMIN 2.2*   No results for input(s): LIPASE, AMYLASE in the last 168 hours. No results for input(s): AMMONIA in the last 168 hours. Coagulation Profile: No results for input(s): INR, PROTIME in the last 168  hours. Cardiac Enzymes: No results for input(s): CKTOTAL, CKMB, CKMBINDEX, TROPONINI in the last 168 hours. BNP (last 3 results) No results for input(s): PROBNP in the last 8760 hours. HbA1C: No results for input(s): HGBA1C in the last 72 hours. CBG: No results for input(s): GLUCAP in the last 168 hours. Lipid Profile: No results for input(s): CHOL, HDL, LDLCALC, TRIG, CHOLHDL, LDLDIRECT in the last 72 hours. Thyroid Function Tests:  Recent Labs  09/28/16 0324  TSH 1.893   Anemia Panel: No results for input(s): VITAMINB12, FOLATE, FERRITIN, TIBC, IRON, RETICCTPCT in the last 72 hours. Urine analysis:    Component Value Date/Time   COLORURINE YELLOW 07/22/2015 1210   APPEARANCEUR CLOUDY (A) 07/22/2015 1210   LABSPEC 1.019 07/22/2015 1210   PHURINE 6.5 07/22/2015 1210   GLUCOSEU NEGATIVE 07/22/2015 1210   HGBUR NEGATIVE 07/22/2015 1210   BILIRUBINUR NEGATIVE 07/22/2015 1210   KETONESUR NEGATIVE 07/22/2015 1210   PROTEINUR NEGATIVE 07/22/2015 1210   UROBILINOGEN 1.0 07/22/2015 1210   NITRITE NEGATIVE 07/22/2015 1210   LEUKOCYTESUR TRACE (A) 07/22/2015 1210   Sepsis Labs: _0 (procalcitonin:4,lacticidven:4)   )No results found for this or any previous visit (from the past 240 hour(s)).    Radiology Studies: Dg Chest 2 View Result Date: 09/27/2016 New small bilateral pleural effusions and bibasilar atelectasis. Subacute, healing right humeral neck fracture.  Ct Angio Chest Pe W And/or Wo Contrast Result Date: 09/27/2016 1. Findings consistent with acute nonocclusive small pulmonary emboli within right middle lobe and left lower lobe segmental pulmonary arterial branches. Positive for acute PE with CT evidence of right heart strain (RV/LV Ratio = 1.3) consistent with at least submassive (intermediate risk) PE. The presence of right heart strain has been associated with an increased risk of morbidity and mortality. Please activate Code PE by paging [phone]. 2.  Moderate bilateral pleural effusions. Partial consolidations in the lower lobes could reflect atelectasis or pneumonia. Multiple nodules or nodular foci of inflammation within the left lung apex. 3. 9 mm left adrenal gland adenoma 4. Partially visualized comminuted fracture of the right humeral neck. Mild compression of T2. Subacute right seventh and eighth rib fractures.    Scheduled Meds: . aspirin EC  81 mg Oral Daily  . aztreonam  1 g Intravenous Q8H  . potassium chloride  40 mEq Oral Once  . vancomycin  750 mg Intravenous Q24H   Continuous Infusions: . heparin 1,250 Units/hr (09/28/16 1723)     LOS: 2 days    Time spent: 15 minutes  Greater than 50% of the time spent on counseling and coordinating the care.   Leisa Lenz, MD [facility] Pager [phone]  If 7PM-7AM, please contact night-coverage [email or web address] Password [email or web address] 09/29/2016, 1:27 PM

## 2016-09-29 NOTE — Progress Notes (Signed)
Insurance check completed for Xarelto S/W [employer] @ [place] # [phone]   XARELTO 20 MG DAILY   COVER- YES  CO-PAY- $ 67.81- DEDUCTIBLE NOT MET  TIER- 3 DRUG  PRIOR APPROVAL- NO  PHARMACY :[facility]

## 2016-09-29 NOTE — Progress Notes (Signed)
ANTICOAGULATION CONSULT NOTE - Follow Up Consult  Pharmacy Consult for Heparin  Indication: pulmonary embolus  Labs:  Recent Labs  09/27/16 1546 09/28/16 0324 09/28/16 1255 09/29/16 0941  HGB 11.4* 11.1*  --   --   HCT 36.6 36.2  --   --   PLT 322 315  --   --   HEPARINUNFRC  --  0.12* 0.24* 0.51  CREATININE 0.89 0.80  --   --     Estimated Creatinine Clearance: 32.3 mL/min (by C-G formula based on SCr of 0.8 mg/dL).  Assessment: 68 you female with new onset PE. Heparin level is 0.51 and at goal.   Goal of Therapy:  Heparin level 0.3-0.7 units/ml Monitor platelets by anticoagulation protocol: Yes   Plan:  -No heparin changes needed -Will confirm a heparin level later today -Daily CBC/Heparin level -Will follow anticoagulation plans  Hildred Laser, Pharm D 09/29/2016 12:17 PM

## 2016-09-29 NOTE — Progress Notes (Signed)
LCSW is aware of PT recommendations for SNF.  Call placed to patient's daughter:  Guerry Minors.  Discussed recommendation and discharge planning. Daughter reports that patient was just released from SNF: [facility] and used almost the max alloted Medicare Days for SNF when she fell and broke her arm.  Daughter thinks she has around 5- 8 days left.  Reported that patient goes through times of depression and feels she is giving up, but when confronted, patient reports she is trying to get better, but per daughter this is the worst accident she has ever had and she is struggling to get back to baseline.  LCSW discussed options of SNF vs Home and daughter reported she would like to bring her Home for her emotional health and daughter has been staying with patient along with brother who lives right next door.  From recent discharge from SNF last Saturday, she was already set up with [facility], PT, RN and CNA. Discussed with CM who will resume services at discharge.  LCSW will be available if other needs arise. Currently, plan is for HOME.  Lane Hacker, MSW Clinical Social Work: [employer] Coverage for :  [phone]

## 2016-09-29 NOTE — Evaluation (Signed)
Physical Therapy Evaluation Patient Details Name: Savannah Parks MRN: [id number] DOB: 10-29-1910 Today's Date: 09/29/2016   History of Present Illness  83 y.o. female admitted for pulmonary embolism. Recent admission (08/2016) following fall with R humeral fx. She went to a SNF for therapy following that hospitalization. She had been home for approx 1 week before curent admission. PMH consists of HTN, HOH and legally blind.   Clinical Impression  Pt admitted with above diagnosis. Pt currently with functional limitations due to the deficits listed below (see PT Problem List). On eval, pt required mod assist bed mobility, min assist transfers and min assist HH ambulation 5 feet. Mobility limited by weakness and fatigue. Pt will benefit from skilled PT to increase their independence and safety with mobility to allow discharge to the venue listed below.  Pt reports her 79 y.o. Daughter has been having difficulty at home providing her needed level of assist.     Follow Up Recommendations SNF;Supervision/Assistance - 24 hour    Equipment Recommendations  None recommended by PT    Recommendations for Other Services       Precautions / Restrictions Precautions Precautions: Fall Required Braces or Orthoses: Sling Restrictions Other Position/Activity Restrictions: Pt still in RUE sling from fx sustained 08/2016. Unsure of WB status. CT shows comminuted fx. Maintained NWB during eval.      Mobility  Bed Mobility Overal bed mobility: Needs Assistance Bed Mobility: Supine to Sit     Supine to sit: Mod assist     General bed mobility comments: assist to elevate trunk. Use of bed pad to scoot to EOB.  Transfers Overall transfer level: Needs assistance Equipment used: None Transfers: Sit to/from [facility] Sit to Stand: Min assist Stand pivot transfers: Min assist       General transfer comment: increased time and multi attempts to complete at min assist level,  verbal cues for sequencing  Ambulation/Gait Ambulation/Gait assistance: Min assist Ambulation Distance (Feet): 5 Feet Assistive device: 1 person hand held assist Gait Pattern/deviations: Step-through pattern;Decreased stride length;Shuffle Gait velocity: decreased   General Gait Details: mild shuffle pattern, distance limited by weakness  Stairs            Wheelchair Mobility    Modified Rankin (Stroke Patients Only)       Balance Overall balance assessment: Needs assistance Sitting-balance support: No upper extremity supported;Feet supported Sitting balance-Leahy Scale: Good     Standing balance support: Single extremity supported;During functional activity Standing balance-Leahy Scale: Poor                               Pertinent Vitals/Pain Pain Assessment: Faces Faces Pain Scale: Hurts a little bit Pain Location: RUE Pain Descriptors / Indicators: Sore Pain Intervention(s): Monitored during session;Repositioned    Home Living Family/patient expects to be discharged to:: Private residence Living Arrangements: Alone Available Help at Discharge: Family;Available 24 hours/day (Pt's daughter has been staying with her since return home from SNF. ) Type of Home: House Home Access: Stairs to enter Entrance Stairs-Rails: None Entrance Stairs-Number of Steps: 1 Home Layout: One level Home Equipment: [place] - 4 wheels Additional Comments: Pt's son lives next door.     Prior Function Level of Independence: Independent with assistive device(s)         Comments: Pt mod I living alone prior to fall in Dec 2017. Following SNF stay, pt returned home min assist for all ADLs and mobility short distances  in home.     Hand Dominance   Dominant Hand: Right    Extremity/Trunk Assessment        Lower Extremity Assessment Lower Extremity Assessment: Generalized weakness    Cervical / Trunk Assessment Cervical / Trunk Assessment: Kyphotic   Communication   Communication: HOH  Cognition Arousal/Alertness: Awake/alert Behavior During Therapy: WFL for tasks assessed/performed Overall Cognitive Status: Within Functional Limits for tasks assessed                      General Comments      Exercises     Assessment/Plan    PT Assessment Patient needs continued PT services  PT Problem List Decreased strength;Decreased activity tolerance;Decreased balance;Decreased mobility;Cardiopulmonary status limiting activity;Pain          PT Treatment Interventions Gait training;Stair training;Functional mobility training;Balance training;Therapeutic exercise;Patient/family education;Therapeutic activities    PT Goals (Current goals can be found in the Care Plan section)  Acute Rehab PT Goals Patient Stated Goal: not stated PT Goal Formulation: With patient Time For Goal Achievement: 10/13/16 Potential to Achieve Goals: Fair    Frequency Min 3X/week   Barriers to discharge        Co-evaluation               End of Session Equipment Utilized During Treatment: Gait belt Activity Tolerance: Patient limited by fatigue Patient left: in chair;with call bell/phone within reach Nurse Communication: Mobility status         Time: 1000-1027 PT Time Calculation (min) (ACUTE ONLY): 27 min   Charges:   PT Evaluation $PT Eval Moderate Complexity: 1 Procedure PT Treatments $Therapeutic Activity: 8-22 mins   PT G Codes:        Savannah Parks 09/29/2016, 10:46 AM

## 2016-09-30 ENCOUNTER — Encounter (HOSPITAL_COMMUNITY): Payer: Self-pay | Admitting: General Practice

## 2016-09-30 LAB — STREP PNEUMONIAE URINARY ANTIGEN: Strep Pneumo Urinary Antigen: NEGATIVE

## 2016-09-30 LAB — CBC
HCT: 33.4 % — ABNORMAL LOW (ref 36.0–46.0)
Hemoglobin: 10.3 g/dL — ABNORMAL LOW (ref 12.0–15.0)
MCH: 25.8 pg — AB (ref 26.0–34.0)
MCHC: 30.8 g/dL (ref 30.0–36.0)
MCV: 83.5 fL (ref 78.0–100.0)
PLATELETS: 338 10*3/uL (ref 150–400)
RBC: 4 MIL/uL (ref 3.87–5.11)
RDW: 17.5 % — ABNORMAL HIGH (ref 11.5–15.5)
WBC: 6 10*3/uL (ref 4.0–10.5)

## 2016-09-30 LAB — HEPARIN LEVEL (UNFRACTIONATED): Heparin Unfractionated: 0.42 IU/mL (ref 0.30–0.70)

## 2016-09-30 MED ORDER — ACETAMINOPHEN 325 MG PO TABS: 650.0000 mg | ORAL_TABLET | Freq: Four times a day (QID) | ORAL | 0 refills | Status: AC | PRN

## 2016-09-30 MED ORDER — APIXABAN 5 MG PO TABS
10.0000 mg | ORAL_TABLET | Freq: Two times a day (BID) | ORAL | Status: DC
  Administered 2016-09-30 – 2016-10-01 (×3): 10 mg via ORAL
  Filled 2016-09-30 (×3): qty 2

## 2016-09-30 MED ORDER — LEVOFLOXACIN IN D5W 750 MG/150ML IV SOLN
750.0000 mg | INTRAVENOUS | Status: DC
  Administered 2016-09-30: 750 mg via INTRAVENOUS
  Filled 2016-09-30: qty 150

## 2016-09-30 MED ORDER — SENNA 8.6 MG PO TABS: 1.0000 | ORAL_TABLET | Freq: Every day | ORAL | 0 refills | Status: AC

## 2016-09-30 MED ORDER — APIXABAN 5 MG PO TABS: 5.0000 mg | ORAL_TABLET | Freq: Two times a day (BID) | ORAL | Status: DC

## 2016-09-30 MED ORDER — APIXABAN 5 MG PO TABS: 10.0000 mg | ORAL_TABLET | Freq: Two times a day (BID) | ORAL | 0 refills | Status: DC

## 2016-09-30 MED ORDER — APIXABAN 5 MG PO TABS: 5.0000 mg | ORAL_TABLET | Freq: Two times a day (BID) | ORAL | 0 refills | Status: AC

## 2016-09-30 NOTE — Discharge Instructions (Signed)
Information on my medicine - ELIQUIS (apixaban)  This medication education was reviewed with me or my healthcare representative as part of my discharge preparation.  The pharmacist that spoke with me during my hospital stay was:  Dareen Piano, [facility]  Why was Eliquis prescribed for you? Eliquis was prescribed to treat blood clots that may have been found in the veins of your legs (deep vein thrombosis) or in your lungs (pulmonary embolism) and to reduce the risk of them occurring again.  What do You need to know about Eliquis ? The starting dose is 10 mg (two 5 mg tablets) taken TWICE daily for the FIRST SEVEN (7) DAYS, then on 10/08/15 the dose is reduced to ONE 5 mg tablet taken TWICE daily.  Eliquis may be taken with or without food.   Try to take the dose about the same time in the morning and in the evening. If you have difficulty swallowing the tablet whole please discuss with your pharmacist how to take the medication safely.  Take Eliquis exactly as prescribed and DO NOT stop taking Eliquis without talking to the doctor who prescribed the medication.  Stopping may increase your risk of developing a new blood clot.  Refill your prescription before you run out.  After discharge, you should have regular check-up appointments with your healthcare provider that is prescribing your Eliquis.    What do you do if you miss a dose? If a dose of ELIQUIS is not taken at the scheduled time, take it as soon as possible on the same day and twice-daily administration should be resumed. The dose should not be doubled to make up for a missed dose.  Important Safety Information A possible side effect of Eliquis is bleeding. You should call your healthcare provider right away if you experience any of the following: ? Bleeding from an injury or your nose that does not stop. ? Unusual colored urine (red or dark brown) or unusual colored stools (red or black). ? Unusual bruising for unknown  reasons. ? A serious fall or if you hit your head (even if there is no bleeding).  Some medicines may interact with Eliquis and might increase your risk of bleeding or clotting while on Eliquis. To help avoid this, consult your healthcare provider or pharmacist prior to using any new prescription or non-prescription medications, including herbals, vitamins, non-steroidal anti-inflammatory drugs (NSAIDs) and supplements.  This website has more information on Eliquis (apixaban): http://www.eliquis.com/eliquis/home

## 2016-09-30 NOTE — Care Management Note (Signed)
Case Management Note [name] RN, BSN Unit 2W-Case Manager [phone]  Patient Details  Name: Savannah Parks MRN: [id number] Date of Birth: [birth year]  Subjective/Objective:  Pt admitted with PE                   Action/Plan: PTA pt was at home with daughter- was active with [facility] for HHRN/PT services (confirmed with Stanton Kidney at [place])- per CSW note- daughter interested in taking pt back home with [facility] services- will need resumption orders for discharge.   Expected Discharge Date:                  Expected Discharge Plan:  [place]  In-House Referral:  Clinical Social Work  Discharge planning Services  CM Consult  Post Acute Care Choice:  Home Health, Resumption of Svcs/PTA Provider Choice offered to:  Adult Children, Patient  DME Arranged:    DME Agency:     HH Arranged:    [place] Agency:  [facility] [employer]  Status of Service:  In process, will continue to follow  If discussed at Long Length of Stay Meetings, dates discussed:    Additional Comments:  09/30/16- 1500- [name] RN, CM- asked to speak with daughter at bedside regarding d/c plans- in to speak with pt and daughter- daughter now interested in pt returning to SNF- and wants to look into rehab options- not sure how many days pt has left- as pt was in [employer] [facility] from 08/18/16 until about a week ago when she went home- will have CSW check into SNF bed days- and f/u with pt's daughter- pt would need to be out of SNF and no readmits for 60 days for Medicare days to reset. CM will continue to follow for d/c needs/plan- HH vs SNF  Dahlia Client Romeo Rabon, RN 09/30/2016, 3:05 PM

## 2016-09-30 NOTE — Progress Notes (Addendum)
ANTICOAGULATION CONSULT NOTE - Follow Up Consult  Pharmacy Consult for Heparin  Indication: pulmonary embolus  Labs:  Recent Labs  09/27/16 1546 09/28/16 0324  09/29/16 0941 09/29/16 1155 09/29/16 1638 09/30/16 0353  HGB 11.4* 11.1*  --   --  10.4*  --  10.3*  HCT 36.6 36.2  --   --  34.2*  --  33.4*  PLT 322 315  --   --  355  --  338  HEPARINUNFRC  --  0.12*  < > 0.51  --  0.40 0.42  CREATININE 0.89 0.80  --   --  0.79  --   --   < > = values in this interval not displayed.  Estimated Creatinine Clearance: 32.3 mL/min (by C-G formula based on SCr of 0.79 mg/dL).  Assessment: 27 you female with new onset PE. Heparin level is 0.42 and at goal.   Goal of Therapy:  Heparin level 0.3-0.7 units/ml Monitor platelets by anticoagulation protocol: Yes   Plan:  -No heparin changes needed -Daily CBC/Heparin level -Will follow anticoagulation plans  Hildred Laser, Pharm D 09/30/2016 8:53 AM   Addendum -change to apixiban -83 years old, SCr= 0.79, CrC ~ 30-45  Plan -apixiban 10mg  po bid for 7 days then 5mg  po bid -will provide patient education  Hildred Laser, Pharm D 09/30/2016 11:37 AM

## 2016-09-30 NOTE — Progress Notes (Signed)
Physical Therapy Treatment Patient Details Name: Savannah Parks MRN: [id number] DOB: 09/13/1910 Today's Date: 09/30/2016    History of Present Illness 83 y.o. female admitted for pulmonary embolism. Recent admission (08/2016) following fall with R humeral fx. She went to a SNF for therapy following that hospitalization. She had been home for approx 1 week before curent admission. PMH consists of HTN, HOH and legally blind.     PT Comments    Pt pleasant and willing to mobilize and perform HEP. Pt remains limited by fatigue and weakness with encouragement to continue to progress mobility with staff. Children present and state current desire for ST-SNF which would be beneficial for pt given her deficits and age of children. Will continue to follow.    Follow Up Recommendations  SNF;Supervision/Assistance - 24 hour     Equipment Recommendations       Recommendations for Other Services       Precautions / Restrictions Precautions Precautions: Fall Required Braces or Orthoses: Sling Restrictions Other Position/Activity Restrictions: Pt still in RUE sling from fx sustained 08/2016. Unsure of WB status. CT shows comminuted fx. Maintained NWB     Mobility  Bed Mobility Overal bed mobility: Needs Assistance Bed Mobility: Supine to Sit     Supine to sit: Mod assist     General bed mobility comments: assist to elevate trunk. Use of bed pad to rotate hips and scoot to EOB  Transfers Overall transfer level: Needs assistance Equipment used: 1 person hand held assist Transfers: Sit to/from Stand Sit to Stand: Min assist         General transfer comment: cues for sequence, assist for anterior translation and rise, increased time  Ambulation/Gait Ambulation/Gait assistance: Min assist Ambulation Distance (Feet): 15 Feet Assistive device: 1 person hand held assist Gait Pattern/deviations: Step-through pattern;Decreased stride length;Shuffle;Trunk flexed   Gait velocity  interpretation: Below normal speed for age/gender General Gait Details: cues for posture and environment, distance limited by fatigue   Stairs            Wheelchair Mobility    Modified Rankin (Stroke Patients Only)       Balance Overall balance assessment: Needs assistance   Sitting balance-Leahy Scale: Good       Standing balance-Leahy Scale: Poor                      Cognition Arousal/Alertness: Awake/alert Behavior During Therapy: WFL for tasks assessed/performed Overall Cognitive Status: Within Functional Limits for tasks assessed                      Exercises General Exercises - Lower Extremity Long Arc Quad: AROM;Both;Seated;15 reps Hip ABduction/ADduction: AROM;Both;10 reps;Seated Hip Flexion/Marching: AROM;Both;Seated;10 reps    General Comments        Pertinent Vitals/Pain Pain Assessment: No/denies pain    Home Living Family/patient expects to be discharged to:: Unsure Living Arrangements: Children                  Prior Function            PT Goals (current goals can now be found in the care plan section) Progress towards PT goals: Progressing toward goals    Frequency    Min 3X/week      PT Plan Current plan remains appropriate    Co-evaluation             End of Session Equipment Utilized During Treatment: Gait belt;Other (comment) (sling) Activity Tolerance:  Patient limited by fatigue Patient left: in chair;with call bell/phone within reach;with family/visitor present;with nursing/sitter in room     Time: [id number] PT Time Calculation (min) (ACUTE ONLY): 22 min  Charges:  $Therapeutic Activity: 8-22 mins                    G Codes:      Aneya Daddona B Lariya Kinzie October 10, 2016, 1:45 PM  Elwyn Reach, [place]

## 2016-09-30 NOTE — Discharge Summary (Signed)
Physician Discharge Summary  Savannah Parks [id number] DOB: 21-Aug-1911 DOA: 09/27/2016  PCP: Leonard Downing, MD  Admit date: 09/27/2016 Discharge date: 10/01/2016  Recommendations for Outpatient Follow-up:  Please continue apixaban first 10 mg twice a day for 7 days. 28 pills prescribed only because apixaban comes in 5 mg pills. This is through 1/20. Then starting 1/31 take 5 mg apixaban twice a day indefinitely.    Discharge Diagnoses:  Active Problems:   Essential hypertension   Pulmonary embolism (HCC)   HCAP (healthcare-associated pneumonia)    Discharge Condition: stable   Diet recommendation: as tolerated   History of present illness:  83 y.o.femalewith medical history significant of HTN, hard of hearing, legally blind, recent hospitalization in 08/2016 for right proximal humeral fracture. She presented to [facility] this time with 2 weeks history of intermittent, non radiating, central chest pain associated with shortness of breath. No fevers and she does have mild cough intermittently productive of clear to whitish sputum. She was in rehab facility and discharged home about 1 week prior to this admission.   CT chest showed acute nonocclusive small pulmonary emboli with right middle lobe and left lower lobe segmental arterial branches, moderate bilateral pleural effusions, partial consolidation in the lower lobes could be atelectasis versus pneumonia as well as subacute right seventh and eighth rib fractures. She was started on heparin drip and azactam and vanco for HCAP.  Hospital Course:  Active Problems: Nonocclusive small pulmonary emboli within right middle lobe and left lower lobe segmental pulmonary arterial branches with right heart strain - Per CT scan, submassive PE - Was on heparin drip but changed this am to apixaban   Sepsis secondary to HCAP / Leukocytosis  - Sepsis criteria met on admission with leukocytosis, tachypnea and source of infection pneumonia  -  CT showed lower lobe pneumonia - Due to recent hospitalization in 08/2016 pt is being treated with empiric vanco and azactam for presumed HCAP - Blood cx not collected for some reason on admission  - Strep pneumonia and resp cx not collected  - Stopped vanco and Azactam 1/23 and will use Levaquin today and tomorrow   Hypokalemia - Supplemented - WNL   DVT prophylaxis: Heparin drip  Code Status: DNR/DNI Family Communication: no family at the bedside this am; called family over the phone and spoke about possible D/C plan in am 1/24 (they are okay with SNF even if it is for few more days but would also consider bringing her home if she is stable)   Consultants:   PT  Procedures:   ECHO  Antimicrobials:   Azactam 09/27/2016 --> 09/30/2016  Vanco 09/27/2016 --> 09/30/2016  Levaquin 09/30/2016 --> 10/01/2016     Signed:  Leisa Lenz, MD  [facility] 09/30/2016, 6:00 PM  Pager #: [phone]  Time spent in minutes: less than 30 minutes  Discharge Exam: Vitals:   09/30/16 0658 09/30/16 1335  BP: 137/63 (!) 148/68  Pulse: 88 96  Resp:  17  Temp: 97.8 F (36.6 C)    Vitals:   09/29/16 0627 09/29/16 2017 09/30/16 0658 09/30/16 1335  BP: (!) 131/59 (!) 151/78 137/63 (!) 148/68  Pulse: 92 88 88 96  Resp:    17  Temp: 97.5 F (36.4 C) 97 F (36.1 C) 97.8 F (36.6 C)   TempSrc: Oral Oral Oral   SpO2: 93% 97% 95% 94%  Weight:      Height:        General: Pt is alert, follows commands appropriately, not  in acute distress Cardiovascular: Regular rate and rhythm, S1/S2 + Respiratory: Clear to auscultation bilaterally, no wheezing, no crackles, no rhonchi Abdominal: Soft, non tender, non distended, bowel sounds +, no guarding Extremities: no cyanosis, pulses palpable bilaterally DP and PT Neuro: Grossly nonfocal  Discharge Instructions  Discharge Instructions    Call MD for:  persistant nausea and vomiting    Complete by:  As directed    Call MD  for:  redness, tenderness, or signs of infection (pain, swelling, redness, odor or green/yellow discharge around incision site)    Complete by:  As directed    Call MD for:  severe uncontrolled pain    Complete by:  As directed    Diet - low sodium heart healthy    Complete by:  As directed    Discharge instructions    Complete by:  As directed    Please continue apixaban first 10 mg twice a day for 7 days. 28 pills prescribed only because apixaban comes in 5 mg pills. This is through 1/20. Then starting 1/31 take 5 mg apixaban twice a day indefinitely.   Increase activity slowly    Complete by:  As directed      Allergies as of 09/30/2016      Reactions   Tape Other (See Comments)   SKIN IS VERY THIN; TAPE WILL TEAR AND BRUISE THE PATIENT'S SKIN!!   Keflet [cephalexin] Rash   Sulfa Antibiotics Rash   Sulfur Rash      Medication List    STOP taking these medications   furosemide 80 MG tablet Commonly known as:  LASIX   OVER THE COUNTER MEDICATION     TAKE these medications   acetaminophen 500 MG tablet Commonly known as:  TYLENOL Take 2 tablets (1,000 mg total) by mouth 3 (three) times daily before meals. What changed:  when to take this  reasons to take this   acetaminophen 325 MG tablet Commonly known as:  TYLENOL Take 2 tablets (650 mg total) by mouth every 6 (six) hours as needed for mild pain (or Fever >/= 101). What changed:  You were already taking a medication with the same name, and this prescription was added. Make sure you understand how and when to take each.   alendronate 70 MG tablet Commonly known as:  FOSAMAX Take 70 mg by mouth every 7 (seven) days. Take usually on Mondays or Tuesdays   apixaban 5 MG Tabs tablet Commonly known as:  ELIQUIS Take 2 tablets (10 mg total) by mouth 2 (two) times daily.   apixaban 5 MG Tabs tablet Commonly known as:  ELIQUIS Take 1 tablet (5 mg total) by mouth 2 (two) times daily. Start taking on:  10/07/2016    aspirin EC 81 MG tablet Take 81 mg by mouth daily.   CALCIUM 600 PO Take 1 tablet by mouth daily.   naphazoline-pheniramine 0.025-0.3 % ophthalmic solution Commonly known as:  NAPHCON-A Place 1 drop into both eyes 4 (four) times daily as needed for irritation or allergies.   omega-3 acid ethyl esters 1 g capsule Commonly known as:  LOVAZA Take 1 g by mouth daily.   senna 8.6 MG Tabs tablet Commonly known as:  SENOKOT Take 1 tablet (8.6 mg total) by mouth at bedtime.   triamcinolone cream 0.1 % Commonly known as:  KENALOG Apply 1 application topically daily as needed (rash). TO LEGS   Vitamin D-3 1000 units Caps Take 1 capsule by mouth daily.  Follow-up Information    Leonard Downing, MD. Schedule an appointment as soon as possible for a visit in 1 week(s).   Specialty:  Family Medicine Contact information: [address] [place] [address] [phone]            The results of significant diagnostics from this hospitalization (including imaging, microbiology, ancillary and laboratory) are listed below for reference.    Significant Diagnostic Studies: Dg Chest 2 View  Result Date: 09/27/2016 CLINICAL DATA:  Chest pain for 1 week.  Hypertension. EXAM: CHEST  2 VIEW COMPARISON:  08/16/2016 FINDINGS: Heart size remains at the upper limits of normal. Aortic atherosclerosis. New small bilateral pleural effusions are seen as well as mild bibasilar atelectasis. No evidence of pulmonary edema or pneumothorax. Comminuted right humeral neck fracture is seen, with partial healing which is increased since previous study. IMPRESSION: New small bilateral pleural effusions and bibasilar atelectasis. Subacute, healing right humeral neck fracture. Electronically Signed   By: Earle Gell M.D.   On: 09/27/2016 16:44   Ct Angio Chest Pe W And/or Wo Contrast  Result Date: 09/27/2016 CLINICAL DATA:  Pleuritic chest pain and new pleural effusion EXAM: CT ANGIOGRAPHY  CHEST WITH CONTRAST TECHNIQUE: Multidetector CT imaging of the chest was performed using the standard protocol during bolus administration of intravenous contrast. Multiplanar CT image reconstructions and MIPs were obtained to evaluate the vascular anatomy. CONTRAST:  80 mL Isovue 370 intravenous COMPARISON:  Chest x-ray 09/27/2016 FINDINGS: Cardiovascular: Satisfactory opacification of the pulmonary arteries to the segmental level. Nonocclusive filling defect is visualized within a right middle lobe segmental pulmonary artery, series 5 image number 126. Additional small nonocclusive filling defect is present within a segmental left lower lobe pulmonary artery, series 5, image number 144. Estimated RV LV ratio is elevated at 1.3. Mild ectasia of the ascending aorta up to 3.7 cm. Atherosclerotic calcifications. No dissection. There are coronary artery calcifications. Mild cardiomegaly. Trace pericardial effusion. Mediastinum/Nodes: Hypodense nodules in the thyroid gland, the largest is seen at the isthmus and measures 1.6 cm. Trachea is midline. The esophagus is grossly unremarkable. No grossly enlarged hilar or mediastinal nodes. Lungs/Pleura: Moderate bilateral pleural effusions. Atelectasis or pneumonia within the bilateral lower lobes. Tiny nodules or nodular densities are present within the apex of the left upper lobe. No pneumothorax. Upper Abdomen: 9 mm left adrenal gland adenoma. Fatty atrophy of the pancreas. Musculoskeletal: Partially visualized comminuted fracture of the right humeral neck. Subacute right eighth and seventh rib fractures. Mild compression fracture of T2. Review of the MIP images confirms the above findings. IMPRESSION: 1. Findings consistent with acute nonocclusive small pulmonary emboli within right middle lobe and left lower lobe segmental pulmonary arterial branches. Positive for acute PE with CT evidence of right heart strain (RV/LV Ratio = 1.3) consistent with at least submassive  (intermediate risk) PE. The presence of right heart strain has been associated with an increased risk of morbidity and mortality. Please activate Code PE by paging [phone]. 2. Moderate bilateral pleural effusions. Partial consolidations in the lower lobes could reflect atelectasis or pneumonia. Multiple nodules or nodular foci of inflammation within the left lung apex. 3. 9 mm left adrenal gland adenoma 4. Partially visualized comminuted fracture of the right humeral neck. Mild compression of T2. Subacute right seventh and eighth rib fractures. Critical Value/emergent results were called by telephone at the time of interpretation on 09/27/2016 at 7:51 pm to Dr. Ovid Curd PAGE , who verbally acknowledged these results. Electronically Signed   By: Maudie Mercury  Francoise Ceo M.D.   On: 09/27/2016 19:51    Microbiology: No results found for this or any previous visit (from the past 240 hour(s)).   Labs: Basic Metabolic Panel:  Recent Labs Lab 09/27/16 1546 09/28/16 0324 09/29/16 1155  NA 133* 134* 134*  K 4.1 3.4* 3.8  CL 97* 101 102  CO2 _0 GLUCOSE 103* 124* 104*  BUN _1 CREATININE 0.89 0.80 0.79  CALCIUM 9.3 8.9 8.9  MG  --  2.0  --   PHOS  --  2.8  --    Liver Function Tests:  Recent Labs Lab 09/28/16 0324  AST 20  ALT 11*  ALKPHOS 83  BILITOT 0.8  PROT 5.3*  ALBUMIN 2.2*   No results for input(s): LIPASE, AMYLASE in the last 168 hours. No results for input(s): AMMONIA in the last 168 hours. CBC:  Recent Labs Lab 09/27/16 1546 09/28/16 0324 09/29/16 1155 09/30/16 0353  WBC 11.4* 9.2 6.5 6.0  HGB 11.4* 11.1* 10.4* 10.3*  HCT 36.6 36.2 34.2* 33.4*  MCV 84.7 84.6 84.0 83.5  PLT 322 315 355 338   Cardiac Enzymes: No results for input(s): CKTOTAL, CKMB, CKMBINDEX, TROPONINI in the last 168 hours. BNP: BNP (last 3 results) No results for input(s): BNP in the last 8760 hours.  ProBNP (last 3 results) No results for input(s): PROBNP in the last 8760  hours.  CBG: No results for input(s): GLUCAP in the last 168 hours.

## 2016-09-30 NOTE — Progress Notes (Signed)
Insurance check completed for Eliquis-  S/W DANIEL @ [employer] # [phone]   ELIQUIS 5 MG BID   COVER- YES  CO-PAY- 15 % OF $ 447.02- $ 67.00  TIER- 3 DRUG  PRIOR APPROVAL- NO  PHARMACY : [facility]  PATIENT HAS L.I.S AND DEDUCTIBLE NOT MET

## 2016-09-30 NOTE — Progress Notes (Addendum)
Patient ID: Savannah Parks, female   DOB: 03/04/1911, 83 y.o.   MRN: [id number]  PROGRESS NOTE    Savannah Parks  [id number] DOB: 1911/08/21 DOA: 09/27/2016  PCP: Leonard Downing, MD   Brief Narrative:  83 y.o. female with medical history significant of HTN, hard of hearing, legally blind, recent hospitalization in 08/2016 for right proximal humeral fracture. She presented to [facility] this time with 2 weeks history of intermittent, non radiating, central chest pain associated with shortness of breath. No fevers and she does have mild cough intermittently productive of clear to whitish sputum. She was in rehab facility and discharged home about 1 week prior to this admission.   CT chest showed acute nonocclusive small pulmonary emboli with right middle lobe and left lower lobe segmental arterial branches, moderate bilateral pleural effusions, partial consolidation in the lower lobes could be atelectasis versus pneumonia as well as subacute right seventh and eighth rib fractures. She was started on heparin drip and azactam and vanco for HCAP.  Assessment & Plan:   Active Problems: Nonocclusive small pulmonary emboli within right middle lobe and left lower lobe segmental pulmonary arterial branches with right heart strain - Per CT scan, submassive PE - Was on heparin drip but changed this am to apixaban   Sepsis secondary to HCAP / Leukocytosis  - Sepsis criteria met on admission with leukocytosis, tachypnea and source of infection pneumonia  - CT showed lower lobe pneumonia - Due to recent hospitalization in 08/2016 pt is being treated with empiric vanco and azactam for presumed HCAP - Blood cx not collected for some reason on admission  - Strep pneumonia and resp cx still pending as of this am (but checked with RN, not collected for some reason) - Stop vanco and Azactam and use empiric Levaquin   Hypokalemia - Supplemented - WNL   DVT prophylaxis: Heparin drip  Code Status:  DNR/DNI Family Communication: no family at the bedside this am; called family over the phone and spoke about possible D/C plan in am 1/24 (they are okay with SNF even if it is for few more days but would also consider bringing her home if she is stable) Disposition Plan: home or SNF depending on PT eval, likely bu 1/24; [facility] orders placed in case she goes home   Consultants:   PT  Procedures:   ECHO  Antimicrobials:   Azactam 09/27/2016 --> 09/30/2016  Vanco 09/27/2016 --> 09/30/2016  Levaquin 09/30/2016 -->   Subjective: No overnight events.   Objective: Vitals:   09/28/16 2120 09/29/16 0627 09/29/16 2017 09/30/16 0658  BP: 137/61 (!) 131/59 (!) 151/78 137/63  Pulse: 85 92 88 88  Resp:      Temp: 98.2 F (36.8 C) 97.5 F (36.4 C) 97 F (36.1 C) 97.8 F (36.6 C)  TempSrc: Oral Oral Oral Oral  SpO2: 96% 93% 97% 95%  Weight:      Height:        Intake/Output Summary (Last 24 hours) at 09/30/16 1335 Last data filed at 09/30/16 1029  Gross per 24 hour  Intake              360 ml  Output              550 ml  Net             -190 ml   Filed Weights   09/28/16 0005  Weight: 72.8 kg (160 lb 8 oz)    Examination:  General exam: Appears in no  distress  Respiratory system: Diminished bilaterally, no wheezing Cardiovascular system: S1 & S2 heard, RRR Gastrointestinal system: (+) BS, non tender, non distended  Central nervous system: No focal deficits  Extremities: No edema, palpable pulses  Skin: warm and dry  Psychiatry: Normal mood and behavior   Data Reviewed: I have personally reviewed following labs and imaging studies  CBC:  Recent Labs Lab 09/27/16 1546 09/28/16 0324 09/29/16 1155 09/30/16 0353  WBC 11.4* 9.2 6.5 6.0  HGB 11.4* 11.1* 10.4* 10.3*  HCT 36.6 36.2 34.2* 33.4*  MCV 84.7 84.6 84.0 83.5  PLT 322 315 355 017   Basic Metabolic Panel:  Recent Labs Lab 09/27/16 1546 09/28/16 0324 09/29/16 1155  NA 133* 134* 134*  K 4.1 3.4* 3.8  CL  97* 101 102  CO2 '25 26 24  ' GLUCOSE 103* 124* 104*  BUN '12 11 11  ' CREATININE 0.89 0.80 0.79  CALCIUM 9.3 8.9 8.9  MG  --  2.0  --   PHOS  --  2.8  --    GFR: Estimated Creatinine Clearance: 32.3 mL/min (by C-G formula based on SCr of 0.79 mg/dL). Liver Function Tests:  Recent Labs Lab 09/28/16 0324  AST 20  ALT 11*  ALKPHOS 83  BILITOT 0.8  PROT 5.3*  ALBUMIN 2.2*   No results for input(s): LIPASE, AMYLASE in the last 168 hours. No results for input(s): AMMONIA in the last 168 hours. Coagulation Profile: No results for input(s): INR, PROTIME in the last 168 hours. Cardiac Enzymes: No results for input(s): CKTOTAL, CKMB, CKMBINDEX, TROPONINI in the last 168 hours. BNP (last 3 results) No results for input(s): PROBNP in the last 8760 hours. HbA1C: No results for input(s): HGBA1C in the last 72 hours. CBG: No results for input(s): GLUCAP in the last 168 hours. Lipid Profile: No results for input(s): CHOL, HDL, LDLCALC, TRIG, CHOLHDL, LDLDIRECT in the last 72 hours. Thyroid Function Tests:  Recent Labs  09/28/16 0324  TSH 1.893   Anemia Panel: No results for input(s): VITAMINB12, FOLATE, FERRITIN, TIBC, IRON, RETICCTPCT in the last 72 hours. Urine analysis:    Component Value Date/Time   COLORURINE YELLOW 07/22/2015 1210   APPEARANCEUR CLOUDY (A) 07/22/2015 1210   LABSPEC 1.019 07/22/2015 1210   PHURINE 6.5 07/22/2015 1210   GLUCOSEU NEGATIVE 07/22/2015 1210   HGBUR NEGATIVE 07/22/2015 1210   BILIRUBINUR NEGATIVE 07/22/2015 1210   KETONESUR NEGATIVE 07/22/2015 1210   PROTEINUR NEGATIVE 07/22/2015 1210   UROBILINOGEN 1.0 07/22/2015 1210   NITRITE NEGATIVE 07/22/2015 1210   LEUKOCYTESUR TRACE (A) 07/22/2015 1210   Sepsis Labs: '@LABRCNTIP' (procalcitonin:4,lacticidven:4)   )No results found for this or any previous visit (from the past 240 hour(s)).    Radiology Studies: Dg Chest 2 View Result Date: 09/27/2016 New small bilateral pleural effusions and  bibasilar atelectasis. Subacute, healing right humeral neck fracture.  Ct Angio Chest Pe W And/or Wo Contrast Result Date: 09/27/2016 1. Findings consistent with acute nonocclusive small pulmonary emboli within right middle lobe and left lower lobe segmental pulmonary arterial branches. Positive for acute PE with CT evidence of right heart strain (RV/LV Ratio = 1.3) consistent with at least submassive (intermediate risk) PE. The presence of right heart strain has been associated with an increased risk of morbidity and mortality. Please activate Code PE by paging [phone]. 2. Moderate bilateral pleural effusions. Partial consolidations in the lower lobes could reflect atelectasis or pneumonia. Multiple nodules or nodular foci of inflammation within the left lung apex. 3. 9 mm left adrenal  gland adenoma 4. Partially visualized comminuted fracture of the right humeral neck. Mild compression of T2. Subacute right seventh and eighth rib fractures.    Scheduled Meds: . aspirin EC  81 mg Oral Daily  . aztreonam  1 g Intravenous Q8H  . potassium chloride  40 mEq Oral Once  . vancomycin  750 mg Intravenous Q24H   Continuous Infusions:    LOS: 3 days    Time spent: 15 minutes  Greater than 50% of the time spent on counseling and coordinating the care.   Leisa Lenz, MD [facility] Pager [phone]  If 7PM-7AM, please contact night-coverage [email or web address] Password [email or web address] 09/30/2016, 1:35 PM

## 2016-10-01 LAB — CBC
HCT: 33.7 % — ABNORMAL LOW (ref 36.0–46.0)
HEMOGLOBIN: 10.4 g/dL — AB (ref 12.0–15.0)
MCH: 25.9 pg — ABNORMAL LOW (ref 26.0–34.0)
MCHC: 30.9 g/dL (ref 30.0–36.0)
MCV: 83.8 fL (ref 78.0–100.0)
Platelets: 349 10*3/uL (ref 150–400)
RBC: 4.02 MIL/uL (ref 3.87–5.11)
RDW: 17.2 % — ABNORMAL HIGH (ref 11.5–15.5)
WBC: 5.4 10*3/uL (ref 4.0–10.5)

## 2016-10-01 LAB — BASIC METABOLIC PANEL
Anion gap: 5 (ref 5–15)
BUN: 8 mg/dL (ref 6–20)
CHLORIDE: 104 mmol/L (ref 101–111)
CO2: 26 mmol/L (ref 22–32)
Calcium: 9.1 mg/dL (ref 8.9–10.3)
Creatinine, Ser: 0.82 mg/dL (ref 0.44–1.00)
GFR calc non Af Amer: 56 mL/min — ABNORMAL LOW (ref >60)
Glucose, Bld: 94 mg/dL (ref 65–99)
POTASSIUM: 3.9 mmol/L (ref 3.5–5.1)
SODIUM: 135 mmol/L (ref 135–145)

## 2016-10-01 NOTE — Progress Notes (Signed)
Right arm edema hard to touch and has several small blisters.

## 2016-10-01 NOTE — NC FL2 (Signed)
[place] LEVEL OF CARE SCREENING TOOL     IDENTIFICATION  Patient Name: Savannah Parks Birthdate: 1911-03-20 Sex: female Admission Date (Current Location): 09/27/2016  [facility] and [place] Number:  Herbalist and Address:  [facility], [place] [address] [place], [place] [address]      Provider Number: [id number]  Attending Physician Name and Address:  Robbie Lis, MD  Relative Name and Phone Number:       Current Level of Care: Hospital Recommended Level of Care: [place] Prior Approval Number:    Date Approved/Denied:   PASRR Number:   [id number]  Discharge Plan: SNF    Current Diagnoses: Patient Active Problem List   Diagnosis Date Noted  . Pulmonary embolism ([place]) 09/27/2016  . HCAP (healthcare-associated pneumonia) 09/27/2016  . Proximal humeral fracture 08/16/2016  . Essential hypertension 08/16/2016  . Bradycardia 08/16/2016  . Pressure injury of skin 08/16/2016  . Closed torus fracture of upper end of right humerus     Orientation RESPIRATION BLADDER Height & Weight     Self, Place  Normal Incontinent Weight: 160 lb 8 oz (72.8 kg) Height:  5\' 2"  (157.5 cm)  BEHAVIORAL SYMPTOMS/MOOD NEUROLOGICAL BOWEL NUTRITION STATUS      Continent Diet (See DC summary)  AMBULATORY STATUS COMMUNICATION OF NEEDS Skin   Extensive Assist Verbally Other (Comment) (Right Lower leg)                       Personal Care Assistance Level of Assistance  Bathing, Feeding, Dressing Bathing Assistance: Limited assistance Feeding assistance: Limited assistance Dressing Assistance: Limited assistance     Functional Limitations Info  Sight, Hearing, Speech Sight Info: Adequate Hearing Info: Adequate Speech Info: Adequate    SPECIAL CARE FACTORS FREQUENCY  PT (By licensed PT), OT (By licensed OT)     PT Frequency: 5x OT Frequency: 5x            Contractures Contractures Info: Not present     Additional Factors Info  Code Status, Allergies Code Status Info: DNR Allergies Info: Tape, Keflet Cephalexin, Sulfa Antibiotics, Sulfur           Current Medications (10/01/2016):  This is the current hospital active medication list Current Facility-Administered Medications  Medication Dose Route Frequency Provider Last Rate Last Dose  . acetaminophen (TYLENOL) tablet 650 mg  650 mg Oral Q6H PRN Toy Baker, MD       Or  . acetaminophen (TYLENOL) suppository 650 mg  650 mg Rectal Q6H PRN Toy Baker, MD      . apixaban (ELIQUIS) tablet 10 mg  10 mg Oral BID Kris Mouton, RPH   10 mg at 10/01/16 [id number]   Followed by  . [START ON 10/07/2016] apixaban (ELIQUIS) tablet 5 mg  5 mg Oral BID Kris Mouton, [facility]      . aspirin EC tablet 81 mg  81 mg Oral Daily Toy Baker, MD   81 mg at 10/01/16 0953  . levofloxacin (LEVAQUIN) IVPB 750 mg  750 mg Intravenous Q48H Robbie Lis, MD   750 mg at 09/30/16 1203  . ondansetron (ZOFRAN) tablet 4 mg  4 mg Oral Q6H PRN Toy Baker, MD       Or  . ondansetron (ZOFRAN) injection 4 mg  4 mg Intravenous Q6H PRN Toy Baker, MD      . polyethylene glycol (MIRALAX / GLYCOLAX) packet 17 g  17 g Oral  Daily PRN Toy Baker, MD      . sodium chloride flush (NS) 0.9 % injection 3 mL  3 mL Intravenous Q12H Toy Baker, MD   3 mL at 10/01/16 1000  . traMADol (ULTRAM) tablet 50 mg  50 mg Oral Q6H PRN Toy Baker, MD   50 mg at 09/30/16 2200     Discharge Medications: Please see discharge summary for a list of discharge medications.  Relevant Imaging Results:  Relevant Lab Results:   Additional Information SS#:[id number]  Lilly Cove, LCSW

## 2016-10-01 NOTE — Consult Note (Addendum)
[place] Nurse wound consult note Reason for Consult: Consult requested for right arm.  Pt states arm was fractured in December and it is currently in a splint.  Wound type: There are no open wounds when the location was assessed.  Right elbow with patchy areas of dried red scabs which remove easily.  Generalized pitting edema to RUE and some patchy areas of small raised clear fluid filled blisters as a result of the swelling.  Currently there is no drainage or open wounds requiring topical treatment.  It skin begins weeping or blisters rupture, then foam dressings can be applied to protect areas and absorb drainage. Please re-consult if further assistance is needed.  Thank-you,  Julien Girt MSN, [place], [place], [place], [place]

## 2016-10-01 NOTE — Progress Notes (Signed)
Patient seen and examined at the bedside. Wound care consulted, hope to see patient prior to discharge. Patient medically stable for discharge today. No changes in medical management since yesterday. Please refer to discharge summary completed 09/30/2016.  Leisa Lenz [facility] [id number]

## 2016-10-01 NOTE — Care Management Important Message (Signed)
Important Message  Patient Details  Name: Sheva Oder MRN: [id number] Date of Birth: 11-Jan-1911   Medicare Important Message Given:  Yes    Nathen May 10/01/2016, 1:38 PM

## 2016-10-01 NOTE — Progress Notes (Signed)
Report called to Rn at [facility], RN verbalized understanding of report. Patient's IV removed and telemetry removed. RN at [facility] health care confirmed that patient can be transported after 4 Pm, when her bed will have been cleaned. Patient's family at bedside and notified that patient will be transported around 4 PM.

## 2016-10-01 NOTE — Care Management Note (Signed)
Case Management Note [name] RN, BSN Unit 2W-Case Manager [phone]  Patient Details  Name: Savannah Parks MRN: [id number] Date of Birth: [birth year]  Subjective/Objective:  Pt admitted with PE                   Action/Plan: PTA pt was at home with daughter- was active with [facility] for HHRN/PT services (confirmed with Stanton Kidney at [facility])- per CSW note- daughter interested in taking pt back home with [facility] services- will need resumption orders for discharge.   Expected Discharge Date:  10/01/16               Expected Discharge Plan:  [place]  In-House Referral:  Clinical Social Work  Discharge planning Services  CM Consult  Post Acute Care Choice:  Home Health, Resumption of Svcs/PTA Provider Choice offered to:  Adult Children, Patient  DME Arranged:    DME Agency:     HH Arranged:    [place] Agency:  [facility] [employer]  Status of Service:  Completed, signed off  If discussed at [employer], dates discussed:    Discharge Disposition: skilled facility   Additional Comments:  10/01/16- 1030- Joellyn Grandt RN, CM- per family - plan for pt to go to [facility] - family willing to pay out of pocket copay cost - short term- CSW to follow up with family for placement needs- Mentioned [facility] as an option.  Per CSW- family has decided to return to [place] working on d/c to SNF today- have notified Stanton Kidney with [facility] of plan to return to SNF- no HH needs for d/c   09/30/16- 1500- Darren Caldron RN, CM- asked to speak with daughter at bedside regarding d/c plans- in to speak with pt and daughter- daughter now interested in pt returning to SNF- and wants to look into rehab options- not sure how many days pt has left- as pt was in [employer] from 08/18/16 until about a week ago when she went home- will have CSW check into SNF bed days- and f/u with pt's daughter- pt would need to be  out of SNF and no readmits for 60 days for Medicare days to reset. CM will continue to follow for d/c needs/plan- HH vs SNF  Dahlia Client Romeo Rabon, RN 10/01/2016, 10:34 AM

## 2016-10-01 NOTE — Progress Notes (Addendum)
Family has changed their minds regarding SNF. Family requesting placement at [facility] if bed available.  Call and referral sent to facility along with other [place] facilities.  LCSW to follow up with referrals once reviewed. Patient has used 34 medicare days at [facility].   Patient has accepted bed at [facility] Patient can arrive after 4:00pm Family aware of plan, in room and in agreement. Report:  [phone] Patient will go by EMS.  Will cotninue to work on discharge and disposition.  Lane Hacker, MSW Clinical Social Work: [employer] Coverage for :  [phone]

## 2016-10-01 NOTE — Clinical Social Work Placement (Addendum)
   CLINICAL SOCIAL WORK PLACEMENT  NOTE  Date:  10/01/2016  Patient Details  Name: Savannah Parks MRN: [id number] Date of Birth: 1911/08/16  Clinical Social Work is seeking post-discharge placement for this patient at the [place] level of care (*CSW will initial, date and re-position this form in  chart as items are completed):  Yes   Patient/family provided with [place] Work Department's list of facilities offering this level of care within the geographic area requested by the patient (or if unable, by the patient's family).  Yes   Patient/family informed of their freedom to choose among providers that offer the needed level of care, that participate in Medicare, Medicaid or managed care program needed by the patient, have an available bed and are willing to accept the patient.  Yes   Patient/family informed of [facility]'s ownership interest in [facility] and [facility], as well as of the fact that they are under no obligation to receive care at these facilities.  PASRR submitted to EDS on       PASRR number received on       Existing PASRR number confirmed on 10/01/16     FL2 transmitted to all facilities in geographic area requested by pt/family on 10/01/16     FL2 transmitted to all facilities within larger geographic area on       Patient informed that his/her managed care company has contracts with or will negotiate with certain facilities, including the following:            Patient/family informed of bed offers received.  10/01/2016   Patient chooses bed at      [facility]  Physician recommends and patient chooses bed at      SNF Patient to be transferred to   on  .  SNF   Patient to be transferred to facility by       EMS  Patient family notified on   of transfer. Family in room, daughter  Name of family member notified:        PHYSICIAN Please sign FL2, Please sign DNR     Additional Comment:     _______________________________________________ Lilly Cove, LCSW 10/01/2016, 11:42 AM

## 2016-11-17 ENCOUNTER — Encounter (HOSPITAL_COMMUNITY): Payer: Self-pay

## 2016-11-17 ENCOUNTER — Emergency Department (HOSPITAL_COMMUNITY)
Admission: EM | Admit: 2016-11-17 | Discharge: 2016-11-18 | Disposition: A | Discharge status: Home / Self Care | Payer: Medicare Other | Source: Home / Self Care | Attending: Emergency Medicine | Admitting: Emergency Medicine

## 2016-11-17 ENCOUNTER — Emergency Department (HOSPITAL_COMMUNITY): Payer: Medicare Other

## 2016-11-17 DIAGNOSIS — Z87891 Personal history of nicotine dependence: Secondary | ICD-10-CM | POA: Insufficient documentation

## 2016-11-17 DIAGNOSIS — R0789 Other chest pain: Secondary | ICD-10-CM

## 2016-11-17 DIAGNOSIS — Z8551 Personal history of malignant neoplasm of bladder: Secondary | ICD-10-CM

## 2016-11-17 DIAGNOSIS — Z7901 Long term (current) use of anticoagulants: Secondary | ICD-10-CM | POA: Insufficient documentation

## 2016-11-17 DIAGNOSIS — I10 Essential (primary) hypertension: Secondary | ICD-10-CM

## 2016-11-17 DIAGNOSIS — Z79899 Other long term (current) drug therapy: Secondary | ICD-10-CM | POA: Insufficient documentation

## 2016-11-17 LAB — BASIC METABOLIC PANEL
ANION GAP: 10 (ref 5–15)
BUN: 11 mg/dL (ref 6–20)
CHLORIDE: 94 mmol/L — AB (ref 101–111)
CO2: 27 mmol/L (ref 22–32)
Calcium: 9.4 mg/dL (ref 8.9–10.3)
Creatinine, Ser: 0.84 mg/dL (ref 0.44–1.00)
GFR calc non Af Amer: 54 mL/min — ABNORMAL LOW (ref >60)
Glucose, Bld: 140 mg/dL — ABNORMAL HIGH (ref 65–99)
POTASSIUM: 3.8 mmol/L (ref 3.5–5.1)
SODIUM: 131 mmol/L — AB (ref 135–145)

## 2016-11-17 LAB — CBC
HEMATOCRIT: 37.9 % (ref 36.0–46.0)
HEMOGLOBIN: 11.3 g/dL — AB (ref 12.0–15.0)
MCH: 23.5 pg — AB (ref 26.0–34.0)
MCHC: 29.8 g/dL — ABNORMAL LOW (ref 30.0–36.0)
MCV: 79 fL (ref 78.0–100.0)
Platelets: 321 10*3/uL (ref 150–400)
RBC: 4.8 MIL/uL (ref 3.87–5.11)
RDW: 18.2 % — ABNORMAL HIGH (ref 11.5–15.5)
WBC: 11.8 10*3/uL — AB (ref 4.0–10.5)

## 2016-11-17 LAB — I-STAT TROPONIN, ED
TROPONIN I, POC: 0 ng/mL (ref 0.00–0.08)
Troponin i, poc: 0 ng/mL (ref 0.00–0.08)

## 2016-11-17 LAB — PROTIME-INR
INR: 1.44
PROTHROMBIN TIME: 17.7 s — AB (ref 11.4–15.2)

## 2016-11-17 MED ORDER — IOPAMIDOL (ISOVUE-370) INJECTION 76%
INTRAVENOUS | Status: AC
  Administered 2016-11-17: 100 mL
  Filled 2016-11-17: qty 100

## 2016-11-17 MED ORDER — LEVOFLOXACIN IN D5W 750 MG/150ML IV SOLN
750.0000 mg | INTRAVENOUS | Status: DC
  Administered 2016-11-17: 750 mg via INTRAVENOUS
  Filled 2016-11-17: qty 150

## 2016-11-17 MED ORDER — LEVOFLOXACIN 500 MG PO TABS: 500.0000 mg | ORAL_TABLET | Freq: Every day | ORAL | 0 refills | Status: DC

## 2016-11-17 MED ORDER — ASPIRIN 81 MG PO CHEW: 324.0000 mg | CHEWABLE_TABLET | Freq: Once | ORAL | Status: DC

## 2016-11-17 NOTE — ED Triage Notes (Signed)
Pt arrived via [facility] c/o sharp, substernal, chest pain for the last 3 weeks.  EMS reports proximal A-fib during transport.  EMS gave 1-Nitro, 324mg  ASA. Pt is Blind.

## 2016-11-17 NOTE — Progress Notes (Signed)
Pharmacy Antibiotic Note  Shaida Route is a 83 y.o. female admitted on 11/17/2016 with pneumonia.  Pharmacy has been consulted for levaquin dosing. WBC is mildly elevated and Scr is WNL.   Plan: Levaquin 750mg  IV Q48H F/u renal fxn, C&S, clinical status and LOT     No data recorded.   Recent Labs Lab 11/17/16 1916  WBC 11.8*  CREATININE 0.84    CrCl cannot be calculated (Unknown ideal weight.).    Allergies  Allergen Reactions  . Tape Other (See Comments)    SKIN IS VERY THIN; TAPE WILL TEAR AND BRUISE THE PATIENT'S SKIN!! MUST USE PAPER TAPE  . Keflet [Cephalexin] Rash  . Sulfa Antibiotics Rash    Antimicrobials this admission: Levaquin 3/12>>  Dose adjustments this admission: N/A  Microbiology results: Pending  Thank you for allowing pharmacy to be a part of this patient's care.  Jalie Eiland, Rande Lawman 11/17/2016 10:38 PM

## 2016-11-17 NOTE — ED Provider Notes (Signed)
[facility] DEPT Provider Note   CSN: [id number] Arrival date & time: 11/17/16  1858     History   Chief Complaint Chief Complaint  Patient presents with  . Chest Pain    HPI Savannah Parks is a 83 y.o. female.  HPI  83 year old female with history of right proximal humeral fracture in 08/2016 with subsequent development of a submassive pulmonary embolism, on eliquis, who presents for evaluation of chest pain. Patient reports experiencing intermittent, substernal, sharp pleuritic chest pain for the last 2 weeks. Pain waxes and wanes in intensity. Denies shortness of breath. Endorses leg swelling, but states that this has been going on for years. Denies cough, fevers, or chills. No nausea, vomiting, diaphoresis, or abdominal pain. States that she lives with her daughter who ensures that she receives all of her medications. Denies ever experiencing pain like this before. Denies history of coronary artery disease. States that she told her son about this today, and he insisted that she come in to be evaluated.  Pt's daughter adds that the pt had a productive cough 1 week ago. Her PCP obtained a CXR that was concerning for PNA, and the pt was started on doxcycline. Pt's daughter states that her cough has resolved, and she has a couple of days of abx left to complete the course.   Past Medical History:  Diagnosis Date  . Cancer [facility])    Bladder  . Hiatal hernia   . High cholesterol   . HOH (hard of hearing)   . Hypertension   . Legally blind   . PE (pulmonary thromboembolism) ([place]) 09/2016  . Sinus drainage     Patient Active Problem List   Diagnosis Date Noted  . Pulmonary embolism ([place]) 09/27/2016  . HCAP (healthcare-associated pneumonia) 09/27/2016  . Proximal humeral fracture 08/16/2016  . Essential hypertension 08/16/2016  . Bradycardia 08/16/2016  . Pressure injury of skin 08/16/2016  . Closed torus fracture of upper end of right humerus     Past Surgical History:    Procedure Laterality Date  . BLADDER REMOVAL  Nov. 1993   part  . DILATION AND CURETTAGE OF UTERUS     50+ years    OB History    No data available       Home Medications    Prior to Admission medications   Medication Sig Start Date End Date Taking? Authorizing Provider  acetaminophen (TYLENOL) 325 MG tablet Take 2 tablets (650 mg total) by mouth every 6 (six) hours as needed for mild pain (or Fever >/= 101). 09/30/16  Yes Robbie Lis, MD  alendronate (FOSAMAX) 70 MG tablet Take 70 mg by mouth every Tuesday. Take usually on Mondays or Tuesdays 07/25/16  Yes Historical Provider, MD  apixaban (ELIQUIS) 5 MG TABS tablet Take 1 tablet (5 mg total) by mouth 2 (two) times daily. Patient taking differently: Take 2.5 mg by mouth 2 (two) times daily.  10/07/16  Yes Robbie Lis, MD  Calcium Carbonate (CALCIUM 600 PO) Take 1 tablet by mouth daily.    Yes Historical Provider, MD  Cholecalciferol (VITAMIN D-3) 1000 units CAPS Take 1 capsule by mouth daily.   Yes Historical Provider, MD  doxycycline (VIBRAMYCIN) 100 MG capsule Take 100 mg by mouth 2 (two) times daily. 11/12/16  Yes Historical Provider, MD  HYDROcodone-acetaminophen (NORCO/VICODIN) 5-325 MG tablet Take 0.5 tablets by mouth at bedtime as needed for pain. 11/10/16  Yes Historical Provider, MD  naphazoline-pheniramine (NAPHCON-A) 0.025-0.3 % ophthalmic solution Place 1 drop  into both eyes 4 (four) times daily as needed for irritation or allergies.    Yes Historical Provider, MD  omega-3 acid ethyl esters (LOVAZA) 1 g capsule Take 1 g by mouth daily.   Yes Historical Provider, MD  senna (SENOKOT) 8.6 MG TABS tablet Take 1 tablet (8.6 mg total) by mouth at bedtime. 09/30/16  Yes Robbie Lis, MD  sucralfate (CARAFATE) 1 g tablet Take 1 g by mouth 2 (two) times daily. 11/10/16  Yes Historical Provider, MD  triamcinolone cream (KENALOG) 0.1 % Apply 1 application topically daily as needed (rash). TO LEGS 07/28/16  Yes Historical Provider, MD     Family History Family History  Problem Relation Age of Onset  . Cancer Father   . Cancer Sister   . Cancer Brother     Social History Social History  Substance Use Topics  . Smoking status: Former Research scientist (life sciences)  . Smokeless tobacco: Former Systems developer    Types: Snuff  . Alcohol use No     Allergies   Tape; Keflet [cephalexin]; and Sulfa antibiotics   Review of Systems Review of Systems  Constitutional: Negative for chills, diaphoresis and fever.  HENT: Negative for congestion, rhinorrhea and sore throat.   Eyes: Negative for visual disturbance.  Respiratory: Negative for cough, shortness of breath and wheezing.   Cardiovascular: Positive for chest pain and leg swelling (chronic). Negative for palpitations.  Gastrointestinal: Positive for constipation (chronic, at baseline). Negative for abdominal distention, abdominal pain, diarrhea, nausea and vomiting.  Genitourinary: Negative for dysuria, flank pain and frequency.  Musculoskeletal: Negative for arthralgias, back pain, gait problem, joint swelling, myalgias, neck pain and neck stiffness.  Skin: Negative for rash.  Neurological: Negative for dizziness, speech difficulty, weakness, numbness and headaches.  Psychiatric/Behavioral: Negative for agitation, behavioral problems and confusion.     Physical Exam Updated Vital Signs BP 157/74   Pulse 93   Resp (!) 30   SpO2 95%   Physical Exam  Constitutional: She is oriented to person, place, and time. She appears well-developed and well-nourished. No distress.  HENT:  Head: Normocephalic and atraumatic.  Right Ear: External ear normal.  Left Ear: External ear normal.  Nose: Nose normal.  Mouth/Throat: Oropharynx is clear and moist. No oropharyngeal exudate.  Eyes: Conjunctivae and EOM are normal. Pupils are equal, round, and reactive to light. Right eye exhibits no discharge. Left eye exhibits no discharge.  Neck: Normal range of motion. Neck supple.  Cardiovascular: Normal  rate, regular rhythm, normal heart sounds and intact distal pulses.  Exam reveals no gallop and no friction rub.   No murmur heard. Pulmonary/Chest: Effort normal and breath sounds normal. No respiratory distress. She has no wheezes. She has no rales. She exhibits no tenderness.  Abdominal: Soft. Bowel sounds are normal. She exhibits no distension. There is no tenderness. There is no guarding.  Musculoskeletal: Normal range of motion. She exhibits edema (3+ pitting edema to the bilateral LEs with venous stasis changes). She exhibits no tenderness.  Neurological: She is alert and oriented to person, place, and time. She exhibits normal muscle tone.  Skin: Skin is warm and dry. No rash noted. She is not diaphoretic.  Psychiatric: She has a normal mood and affect. Her behavior is normal.     ED Treatments / Results  Labs (all labs ordered are listed, but only abnormal results are displayed) Labs Reviewed  BASIC METABOLIC PANEL - Abnormal; Notable for the following:       Result Value   Sodium  131 (*)    Chloride 94 (*)    Glucose, Bld 140 (*)    GFR calc non Af Amer 54 (*)    All other components within normal limits  CBC - Abnormal; Notable for the following:    WBC 11.8 (*)    Hemoglobin 11.3 (*)    MCH 23.5 (*)    MCHC 29.8 (*)    RDW 18.2 (*)    All other components within normal limits  PROTIME-INR - Abnormal; Notable for the following:    Prothrombin Time 17.7 (*)    All other components within normal limits  I-STAT TROPOININ, ED  I-STAT TROPOININ, ED  I-STAT TROPOININ, ED    EKG  EKG Interpretation  Date/Time:  Monday November 17 2016 19:02:39 EDT Ventricular Rate:  83 PR Interval:    QRS Duration: 128 QT Interval:  391 QTC Calculation: 460 R Axis:   78 Text Interpretation:  Sinus rhythm Atrial premature complexes Right bundle branch block Confirmed by DELO  MD, DOUGLAS ([id number]) on 11/17/2016 7:39:45 PM       Radiology Dg Chest 2 View  Result Date:  11/17/2016 CLINICAL DATA:  Hervey Ard, substernal chest pain for 3 weeks EXAM: CHEST  2 VIEW COMPARISON:  Chest radiograph 09/27/2016 FINDINGS: Cardiomediastinal contours are unchanged with atherosclerotic calcification in the aortic arch. Small bilateral pleural effusions and associated basilar atelectasis are unchanged. No pneumothorax. No pulmonary edema. IMPRESSION: Unchanged small bilateral pleural effusions and associated atelectasis. Calcific aortic atherosclerosis. Electronically Signed   By: Ulyses Jarred M.D.   On: 11/17/2016 19:57   Ct Angio Chest Pe W And/or Wo Contrast  Result Date: 11/17/2016 CLINICAL DATA:  83 year old female with history of pulmonary embolism. Evaluate for increased clot burden. EXAM: CT ANGIOGRAPHY CHEST WITH CONTRAST TECHNIQUE: Multidetector CT imaging of the chest was performed using the standard protocol during bolus administration of intravenous contrast. Multiplanar CT image reconstructions and MIPs were obtained to evaluate the vascular anatomy. CONTRAST:  80 mL of Isovue 370. COMPARISON:  Chest CT 09/27/2016. FINDINGS: Cardiovascular: No filling defects within the pulmonary arterial tree to suggest underlying pulmonary embolism. Heart size is normal. Trace amount of pericardial fluid and/or thickening, unlikely to be of any hemodynamic significance at this time. No associated pericardial calcification. There is aortic atherosclerosis, as well as atherosclerosis of the great vessels of the mediastinum and the coronary arteries, including calcified atherosclerotic plaque in the left main, left anterior descending and right coronary arteries. Calcifications of the aortic valve and mitral annulus (mild). Mediastinum/Nodes: No pathologically enlarged mediastinal or hilar lymph nodes. Esophagus is unremarkable in appearance. No axillary lymphadenopathy. Lungs/Pleura: Patchy multifocal ground-glass attenuation and mild interlobular septal thickening suggesting a background of mild  interstitial pulmonary edema. Small focus of consolidation and volume loss in the left lower lobe, best appreciated on axial image 74 of series 7, concerning for infection. Moderate bilateral pleural effusions lying dependently. No definite suspicious appearing pulmonary nodules or masses are noted. Upper Abdomen: Aortic atherosclerosis. Musculoskeletal: Old compression fracture of superior endplate of T2 with approximately 20% loss of vertebral body height is unchanged. There are no aggressive appearing lytic or blastic lesions noted in the visualized portions of the skeleton. Review of the MIP images confirms the above findings. IMPRESSION: 1. No evidence of pulmonary embolism. 2. Small focus of airspace consolidation and volume loss in the left lower lobe, concerning for pneumonia. 3. Background of mild interstitial pulmonary edema and moderate bilateral pleural effusions. 4. Aortic atherosclerosis, in addition to left main  and 3 vessel coronary artery disease. 5. There are calcifications of the aortic valve and mitral annulus. Echocardiographic correlation for evaluation of potential valvular dysfunction may be warranted if clinically indicated. Electronically Signed   By: Vinnie Langton M.D.   On: 11/17/2016 21:29    Procedures Procedures (including critical care time)  Medications Ordered in ED Medications  levofloxacin (LEVAQUIN) IVPB 750 mg (750 mg Intravenous New Bag/Given 11/17/16 2321)  iopamidol (ISOVUE-370) 76 % injection (100 mLs  Contrast Given 11/17/16 2057)     Initial Impression / Assessment and Plan / ED Course  I have reviewed the triage vital signs and the nursing notes.  Pertinent labs & imaging results that were available during my care of the patient were reviewed by me and considered in my medical decision making (see chart for details).     Patient is generally well-appearing. Afebrile and hemodynamically stable. Lungs are CTAB. Despite patient's respiratory rate being  charted as 30, I do not appreciate tachypnea on multiple assessments at the patient's bedside. EKG is unchanged from prior, and description of the patient's pain would be atypical for cardiac chest pain (sharp, non-exertional, no radiation, nausea, or diaphoresis). Delta troponins are 0. Given recent pulmonary embolism, CTA of the chest was obtained, with no evidence of pulmonary embolism or dissection. Patient does have a small focus of airspace consolidation in the left lower lobe, concerning for pneumonia. This same consolidation was commented on on the patient's last CT scan in January. Patient is also currently undergoing treatment with doxycycline prescribed by her PCP for suspected pneumonia, and the patient and her daughter report that her cough has resolved. She continues to sat well on room air and does not have increased work of breathing, and I doubt new pneumonia. Dose of IV Levaquin given in the ED, but will not prescribe additional antibiotic, as I think that the patient is already on appropriate outpatient therapy.  Discussed with the patient the possibility of bringing her into the hospital for further workup, but overall I have a low suspicion for underlying cardiac etiology. As part of shared decision making, patient will be discharged home with return to the emergency department immediately for worsening symptoms. Patient's son and daughter are in agreement with this plan. Patient discharged with outpatient follow-up.  Care of patient overseen by my attending, Dr. Stark Jock.  Final Clinical Impressions(s) / ED Diagnoses   Final diagnoses:  Atypical chest pain    New Prescriptions Current Discharge Medication List       Zipporah Plants, MD 11/18/16 0002    Veryl Speak, MD 11/18/16 1525

## 2016-11-17 NOTE — ED Notes (Signed)
Patient transported to CT 

## 2016-11-19 ENCOUNTER — Emergency Department (HOSPITAL_COMMUNITY): Payer: Medicare Other

## 2016-11-19 ENCOUNTER — Inpatient Hospital Stay (HOSPITAL_COMMUNITY)
Admission: EM | Admit: 2016-11-19 | Discharge: 2016-11-24 | DRG: 291 | Disposition: A | Discharge status: Skilled Nursing Facility | Payer: Medicare Other | Attending: Internal Medicine | Admitting: Internal Medicine

## 2016-11-19 ENCOUNTER — Encounter (HOSPITAL_COMMUNITY): Payer: Self-pay | Admitting: Emergency Medicine

## 2016-11-19 DIAGNOSIS — E7800 Pure hypercholesterolemia, unspecified: Secondary | ICD-10-CM | POA: Diagnosis present

## 2016-11-19 DIAGNOSIS — K219 Gastro-esophageal reflux disease without esophagitis: Secondary | ICD-10-CM | POA: Diagnosis not present

## 2016-11-19 DIAGNOSIS — H9190 Unspecified hearing loss, unspecified ear: Secondary | ICD-10-CM | POA: Diagnosis present

## 2016-11-19 DIAGNOSIS — S0083XA Contusion of other part of head, initial encounter: Secondary | ICD-10-CM | POA: Diagnosis present

## 2016-11-19 DIAGNOSIS — J189 Pneumonia, unspecified organism: Secondary | ICD-10-CM | POA: Diagnosis present

## 2016-11-19 DIAGNOSIS — L03115 Cellulitis of right lower limb: Secondary | ICD-10-CM | POA: Diagnosis present

## 2016-11-19 DIAGNOSIS — R627 Adult failure to thrive: Secondary | ICD-10-CM | POA: Diagnosis present

## 2016-11-19 DIAGNOSIS — Y92009 Unspecified place in unspecified non-institutional (private) residence as the place of occurrence of the external cause: Secondary | ICD-10-CM

## 2016-11-19 DIAGNOSIS — I110 Hypertensive heart disease with heart failure: Principal | ICD-10-CM | POA: Diagnosis present

## 2016-11-19 DIAGNOSIS — R531 Weakness: Secondary | ICD-10-CM

## 2016-11-19 DIAGNOSIS — H548 Legal blindness, as defined in USA: Secondary | ICD-10-CM | POA: Diagnosis present

## 2016-11-19 DIAGNOSIS — W19XXXA Unspecified fall, initial encounter: Secondary | ICD-10-CM

## 2016-11-19 DIAGNOSIS — D638 Anemia in other chronic diseases classified elsewhere: Secondary | ICD-10-CM | POA: Diagnosis present

## 2016-11-19 DIAGNOSIS — I5033 Acute on chronic diastolic (congestive) heart failure: Secondary | ICD-10-CM | POA: Diagnosis not present

## 2016-11-19 DIAGNOSIS — Z86711 Personal history of pulmonary embolism: Secondary | ICD-10-CM

## 2016-11-19 DIAGNOSIS — Z66 Do not resuscitate: Secondary | ICD-10-CM | POA: Diagnosis present

## 2016-11-19 DIAGNOSIS — Z7983 Long term (current) use of bisphosphonates: Secondary | ICD-10-CM

## 2016-11-19 DIAGNOSIS — Z79899 Other long term (current) drug therapy: Secondary | ICD-10-CM

## 2016-11-19 DIAGNOSIS — W1839XA Other fall on same level, initial encounter: Secondary | ICD-10-CM | POA: Diagnosis present

## 2016-11-19 DIAGNOSIS — Z87891 Personal history of nicotine dependence: Secondary | ICD-10-CM

## 2016-11-19 DIAGNOSIS — Z7901 Long term (current) use of anticoagulants: Secondary | ICD-10-CM

## 2016-11-19 LAB — COMPREHENSIVE METABOLIC PANEL
ALBUMIN: 2.5 g/dL — AB (ref 3.5–5.0)
ALK PHOS: 76 U/L (ref 38–126)
ALT: 10 U/L — AB (ref 14–54)
AST: 21 U/L (ref 15–41)
Anion gap: 7 (ref 5–15)
BILIRUBIN TOTAL: 0.4 mg/dL (ref 0.3–1.2)
BUN: 9 mg/dL (ref 6–20)
CALCIUM: 9.5 mg/dL (ref 8.9–10.3)
CO2: 25 mmol/L (ref 22–32)
CREATININE: 0.79 mg/dL (ref 0.44–1.00)
Chloride: 101 mmol/L (ref 101–111)
GFR calc Af Amer: >60 mL/min (ref >60)
GFR calc non Af Amer: >60 mL/min (ref >60)
GLUCOSE: 104 mg/dL — AB (ref 65–99)
Potassium: 4.3 mmol/L (ref 3.5–5.1)
SODIUM: 133 mmol/L — AB (ref 135–145)
Total Protein: 6 g/dL — ABNORMAL LOW (ref 6.5–8.1)

## 2016-11-19 LAB — URINALYSIS, ROUTINE W REFLEX MICROSCOPIC
Bacteria, UA: NONE SEEN
Bilirubin Urine: NEGATIVE
GLUCOSE, UA: NEGATIVE mg/dL
Ketones, ur: NEGATIVE mg/dL
Leukocytes, UA: NEGATIVE
Nitrite: NEGATIVE
PROTEIN: NEGATIVE mg/dL
Specific Gravity, Urine: 1.015 (ref 1.005–1.030)
Squamous Epithelial / LPF: NONE SEEN
pH: 6 (ref 5.0–8.0)

## 2016-11-19 LAB — CBC WITH DIFFERENTIAL/PLATELET
BASOS PCT: 0 %
Basophils Absolute: 0 10*3/uL (ref 0.0–0.1)
EOS ABS: 0.3 10*3/uL (ref 0.0–0.7)
Eosinophils Relative: 4 %
HEMATOCRIT: 38.5 % (ref 36.0–46.0)
Hemoglobin: 11.5 g/dL — ABNORMAL LOW (ref 12.0–15.0)
LYMPHS ABS: 1.7 10*3/uL (ref 0.7–4.0)
Lymphocytes Relative: 25 %
MCH: 23.6 pg — AB (ref 26.0–34.0)
MCHC: 29.9 g/dL — AB (ref 30.0–36.0)
MCV: 79.1 fL (ref 78.0–100.0)
MONO ABS: 0.7 10*3/uL (ref 0.1–1.0)
MONOS PCT: 10 %
Neutro Abs: 4.1 10*3/uL (ref 1.7–7.7)
Neutrophils Relative %: 61 %
Platelets: 324 10*3/uL (ref 150–400)
RBC: 4.87 MIL/uL (ref 3.87–5.11)
RDW: 18.2 % — AB (ref 11.5–15.5)
WBC: 6.8 10*3/uL (ref 4.0–10.5)

## 2016-11-19 LAB — CBG MONITORING, ED: Glucose-Capillary: 102 mg/dL — ABNORMAL HIGH (ref 65–99)

## 2016-11-19 LAB — TROPONIN I: Troponin I: <0.03 ng/mL (ref <0.03)

## 2016-11-19 LAB — BRAIN NATRIURETIC PEPTIDE: B Natriuretic Peptide: 102.1 pg/mL — ABNORMAL HIGH (ref 0.0–100.0)

## 2016-11-19 MED ORDER — CALCIUM CARBONATE 1250 (500 CA) MG PO TABS
500.0000 mg | ORAL_TABLET | Freq: Every day | ORAL | Status: DC
  Administered 2016-11-20 – 2016-11-24 (×5): 500 mg via ORAL
  Filled 2016-11-19 (×5): qty 1

## 2016-11-19 MED ORDER — SUCRALFATE 1 G PO TABS
1.0000 g | ORAL_TABLET | Freq: Two times a day (BID) | ORAL | Status: DC
  Administered 2016-11-20 – 2016-11-24 (×10): 1 g via ORAL
  Filled 2016-11-19 (×10): qty 1

## 2016-11-19 MED ORDER — SENNA 8.6 MG PO TABS
1.0000 | ORAL_TABLET | Freq: Every day | ORAL | Status: DC
  Administered 2016-11-20 – 2016-11-23 (×5): 8.6 mg via ORAL
  Filled 2016-11-19 (×5): qty 1

## 2016-11-19 MED ORDER — ONDANSETRON HCL 4 MG/2ML IJ SOLN
4.0000 mg | Freq: Four times a day (QID) | INTRAMUSCULAR | Status: DC | PRN
  Administered 2016-11-20: 4 mg via INTRAVENOUS
  Filled 2016-11-19: qty 2

## 2016-11-19 MED ORDER — APIXABAN 2.5 MG PO TABS
2.5000 mg | ORAL_TABLET | Freq: Two times a day (BID) | ORAL | Status: DC
  Administered 2016-11-20 – 2016-11-24 (×10): 2.5 mg via ORAL
  Filled 2016-11-19 (×10): qty 1

## 2016-11-19 MED ORDER — SODIUM CHLORIDE 0.9% FLUSH
3.0000 mL | Freq: Two times a day (BID) | INTRAVENOUS | Status: DC
  Administered 2016-11-20 – 2016-11-24 (×9): 3 mL via INTRAVENOUS

## 2016-11-19 MED ORDER — FUROSEMIDE 10 MG/ML IJ SOLN
20.0000 mg | INTRAMUSCULAR | Status: AC
  Administered 2016-11-20: 20 mg via INTRAVENOUS
  Filled 2016-11-19: qty 2

## 2016-11-19 MED ORDER — HYDROCODONE-ACETAMINOPHEN 5-325 MG PO TABS
0.5000 | ORAL_TABLET | ORAL | Status: DC | PRN
  Administered 2016-11-20 – 2016-11-24 (×6): 0.5 via ORAL
  Filled 2016-11-19 (×6): qty 1

## 2016-11-19 MED ORDER — ACETAMINOPHEN 325 MG PO TABS
650.0000 mg | ORAL_TABLET | ORAL | Status: DC | PRN
  Administered 2016-11-21 – 2016-11-24 (×6): 650 mg via ORAL
  Filled 2016-11-19 (×7): qty 2

## 2016-11-19 MED ORDER — SODIUM CHLORIDE 0.9 % IV SOLN: 250.0000 mL | INTRAVENOUS | Status: DC | PRN

## 2016-11-19 MED ORDER — NAPHAZOLINE-PHENIRAMINE 0.025-0.3 % OP SOLN: 1.0000 [drp] | Freq: Four times a day (QID) | OPHTHALMIC | Status: DC | PRN

## 2016-11-19 MED ORDER — CLINDAMYCIN PHOSPHATE 600 MG/50ML IV SOLN: 600.0000 mg | Freq: Three times a day (TID) | INTRAVENOUS | Status: DC

## 2016-11-19 MED ORDER — SODIUM CHLORIDE 0.9% FLUSH: 3.0000 mL | INTRAVENOUS | Status: DC | PRN

## 2016-11-19 NOTE — ED Provider Notes (Signed)
[facility] DEPT Provider Note   CSN: [id number] Arrival date & time: 11/19/16  1942     History   Chief Complaint Chief Complaint  Patient presents with  . Weakness    HPI Savannah Parks is a 83 y.o. female.  Patient has a history of a PE. Patient has had continued chest pain and was seen a couple days ago with a CT scan that did not show any new PE. Patient has been weak for the last few days she fell today and hit her head but no loss of consciousness   The history is provided by a relative. No language interpreter was used.  Weakness  Primary symptoms include no loss of sensation. This is a recurrent problem. The current episode started more than 2 days ago. The problem has not changed since onset.There was no focality noted. There has been no fever. Pertinent negatives include no shortness of breath, no chest pain and no headaches. There were no medications administered prior to arrival. Associated medical issues do not include CVA.    Past Medical History:  Diagnosis Date  . Cancer [facility])    Bladder  . Hiatal hernia   . High cholesterol   . HOH (hard of hearing)   . Hypertension   . Legally blind   . PE (pulmonary thromboembolism) ([place]) 09/2016  . Sinus drainage     Patient Active Problem List   Diagnosis Date Noted  . Pulmonary embolism ([place]) 09/27/2016  . HCAP (healthcare-associated pneumonia) 09/27/2016  . Proximal humeral fracture 08/16/2016  . Essential hypertension 08/16/2016  . Bradycardia 08/16/2016  . Pressure injury of skin 08/16/2016  . Closed torus fracture of upper end of right humerus     Past Surgical History:  Procedure Laterality Date  . BLADDER REMOVAL  Nov. 1993   part  . DILATION AND CURETTAGE OF UTERUS     50+ years    OB History    No data available       Home Medications    Prior to Admission medications   Medication Sig Start Date End Date Taking? Authorizing Provider  acetaminophen (TYLENOL) 325 MG tablet Take 2  tablets (650 mg total) by mouth every 6 (six) hours as needed for mild pain (or Fever >/= 101). 09/30/16   Robbie Lis, MD  alendronate (FOSAMAX) 70 MG tablet Take 70 mg by mouth every Tuesday. Take usually on Mondays or Tuesdays 07/25/16   Historical Provider, MD  apixaban (ELIQUIS) 5 MG TABS tablet Take 1 tablet (5 mg total) by mouth 2 (two) times daily. Patient taking differently: Take 2.5 mg by mouth 2 (two) times daily.  10/07/16   Robbie Lis, MD  Calcium Carbonate (CALCIUM 600 PO) Take 1 tablet by mouth daily.     Historical Provider, MD  Cholecalciferol (VITAMIN D-3) 1000 units CAPS Take 1 capsule by mouth daily.    Historical Provider, MD  doxycycline (VIBRAMYCIN) 100 MG capsule Take 100 mg by mouth 2 (two) times daily. 11/12/16   Historical Provider, MD  HYDROcodone-acetaminophen (NORCO/VICODIN) 5-325 MG tablet Take 0.5 tablets by mouth at bedtime as needed for pain. 11/10/16   Historical Provider, MD  naphazoline-pheniramine (NAPHCON-A) 0.025-0.3 % ophthalmic solution Place 1 drop into both eyes 4 (four) times daily as needed for irritation or allergies.     Historical Provider, MD  omega-3 acid ethyl esters (LOVAZA) 1 g capsule Take 1 g by mouth daily.    Historical Provider, MD  senna (SENOKOT) 8.6 MG TABS  tablet Take 1 tablet (8.6 mg total) by mouth at bedtime. 09/30/16   Robbie Lis, MD  sucralfate (CARAFATE) 1 g tablet Take 1 g by mouth 2 (two) times daily. 11/10/16   Historical Provider, MD  triamcinolone cream (KENALOG) 0.1 % Apply 1 application topically daily as needed (rash). TO LEGS 07/28/16   Historical Provider, MD    Family History Family History  Problem Relation Age of Onset  . Cancer Father   . Cancer Sister   . Cancer Brother     Social History Social History  Substance Use Topics  . Smoking status: Former Research scientist (life sciences)  . Smokeless tobacco: Former Systems developer    Types: Snuff  . Alcohol use No     Allergies   Tape; Keflet [cephalexin]; and Sulfa antibiotics   Review  of Systems Review of Systems  Constitutional: Negative for appetite change and fatigue.  HENT: Negative for congestion, ear discharge and sinus pressure.   Eyes: Negative for discharge.  Respiratory: Negative for cough and shortness of breath.   Cardiovascular: Negative for chest pain.  Gastrointestinal: Negative for abdominal pain and diarrhea.  Genitourinary: Negative for frequency and hematuria.  Musculoskeletal: Negative for back pain.  Skin: Negative for rash.  Neurological: Positive for weakness. Negative for seizures and headaches.  Psychiatric/Behavioral: Negative for hallucinations.     Physical Exam Updated Vital Signs BP 152/74   Pulse 99   Temp 97.9 F (36.6 C) (Oral)   Resp 25   Ht 5\' 4"  (1.626 m)   Wt 160 lb (72.6 kg)   SpO2 95%   BMI 27.46 kg/m   Physical Exam  Constitutional: She is oriented to person, place, and time. She appears well-developed.  HENT:  Head: Normocephalic.  Mild bruising to forehead.  Eyes: Conjunctivae and EOM are normal. No scleral icterus.  Neck: Neck supple. No thyromegaly present.  Cardiovascular: Normal rate and regular rhythm.  Exam reveals no gallop and no friction rub.   No murmur heard. Pulmonary/Chest: No stridor. She has no wheezes. She has no rales. She exhibits no tenderness.  Abdominal: She exhibits no distension. There is no tenderness. There is no rebound.  Musculoskeletal: Normal range of motion. She exhibits edema.  3+ edema in both legs up to her knees  Lymphadenopathy:    She has no cervical adenopathy.  Neurological: She is oriented to person, place, and time. She exhibits normal muscle tone. Coordination normal.  Skin: No rash noted. No erythema.  Psychiatric: She has a normal mood and affect. Her behavior is normal.     ED Treatments / Results  Labs (all labs ordered are listed, but only abnormal results are displayed) Labs Reviewed  URINALYSIS, ROUTINE W REFLEX MICROSCOPIC - Abnormal; Notable for the  following:       Result Value   Hgb urine dipstick SMALL (*)    All other components within normal limits  CBC WITH DIFFERENTIAL/PLATELET - Abnormal; Notable for the following:    Hemoglobin 11.5 (*)    MCH 23.6 (*)    MCHC 29.9 (*)    RDW 18.2 (*)    All other components within normal limits  COMPREHENSIVE METABOLIC PANEL - Abnormal; Notable for the following:    Sodium 133 (*)    Glucose, Bld 104 (*)    Total Protein 6.0 (*)    Albumin 2.5 (*)    ALT 10 (*)    All other components within normal limits  BRAIN NATRIURETIC PEPTIDE - Abnormal; Notable for the following:  B Natriuretic Peptide 102.1 (*)    All other components within normal limits  CBG MONITORING, ED - Abnormal; Notable for the following:    Glucose-Capillary 102 (*)    All other components within normal limits  TROPONIN I    EKG  EKG Interpretation  Date/Time:  Wednesday November 19 2016 19:48:34 EDT Ventricular Rate:  73 PR Interval:    QRS Duration: 138 QT Interval:  407 QTC Calculation: 449 R Axis:   74 Text Interpretation:  Sinus rhythm Multiple ventricular premature complexes Right bundle branch block Borderline ST elevation, lateral leads Confirmed by Jen Eppinger  MD, Ellary Casamento [id number]) on 11/19/2016 7:52:07 PM       Radiology Ct Head Wo Contrast  Result Date: 11/19/2016 CLINICAL DATA:  Fall.  History of hypertension and cancer. EXAM: CT HEAD WITHOUT CONTRAST TECHNIQUE: Contiguous axial images were obtained from the base of the skull through the vertex without intravenous contrast. COMPARISON:  08/15/2016 FINDINGS: Brain: Diffuse cerebral atrophy. Ventricular dilatation consistent with central atrophy. Low-attenuation changes throughout the deep white matter consistent with small vessel ischemia. No mass effect or midline shift. No abnormal extra-axial fluid collections. Gray-white matter junctions are distinct. Basal cisterns are not effaced. No acute intracranial hemorrhage. Vascular: Vascular calcifications  are present in the carotid siphons and vertebrobasilar vessels. Skull: Calvarium appears intact. Sinuses/Orbits: Visualized paranasal sinuses and mastoid air cells are not opacified. Other: Small subcutaneous scalp hematoma over the left anterior frontal region. IMPRESSION: No acute intracranial abnormalities. Chronic atrophy and small vessel ischemic changes. Electronically Signed   By: Lucienne Capers M.D.   On: 11/19/2016 22:09   Dg Chest Port 1 View  Result Date: 11/19/2016 CLINICAL DATA:  83 year old female with shortness of breath. EXAM: PORTABLE CHEST 1 VIEW COMPARISON:  Chest CT dated 11/17/2016 FINDINGS: There bilateral pleural effusions with bibasilar compressive atelectasis versus infiltrate. Curvilinear density extending from the left hilum may represent atelectasis or scarring. There is no pneumothorax. The cardiac silhouette is within normal limits. The aorta is tortuous. There is atherosclerotic calcification of the thoracic aorta. There is osteopenia with old healed fracture deformity of the right humeral neck. No acute fracture identified. IMPRESSION: 1. Bilateral pleural effusions with bibasilar atelectasis versus infiltrate. 2. Aortic atherosclerosis. Electronically Signed   By: Anner Crete M.D.   On: 11/19/2016 20:35    Procedures Procedures (including critical care time)  Medications Ordered in ED Medications - No data to display   Initial Impression / Assessment and Plan / ED Course  I have reviewed the triage vital signs and the nursing notes.  Pertinent labs & imaging results that were available during my care of the patient were reviewed by me and considered in my medical decision making (see chart for details).    Patient with congestive heart failure and weakness patient will be admitted  Final Clinical Impressions(s) / ED Diagnoses   Final diagnoses:  Weakness  Fall, initial encounter    New Prescriptions New Prescriptions   No medications on file      Milton Ferguson, MD 11/19/16 2308

## 2016-11-19 NOTE — ED Triage Notes (Signed)
BIB EMS from home for gen weakness. Family was attempting to get pt from bed to commode using gait belt, states she became weak and they lowered her to the floor, denies that she hit her head. However, pt states she did hit her head on dresser and has a hematoma to L forehead. Pt recently seen here and dx with pneumonia. Pt is A/OX4, VSS.

## 2016-11-19 NOTE — H&P (Addendum)
History and Physical    Sachiko Methot XNA:[id number] DOB: [birth year] DOA: 11/19/2016  Referring MD/NP/PA: Dr. Roderic Palau PCP: Hayden Rasmussen., MD  Patient coming from: Home  Chief Complaint: Generalized weakness  HPI: Taylinn Brabant is a 83 y.o. female with medical history significant of HTN, diastolic CHF last EF 54-27% with grade 2 dFx on 09/2016, HOH, legally blind, PE on Eliquis; resents with complaints of generalized weakness. History is obtained with the assistance of family who are present at bedside. The patient legs apparently gave way today and she was guided down to the floor. She was noted to have hit her forehead, but was not noted to have loss consciousness. Last few days family notes that the patient has had decreased overall appetite, complained of generalized pain all over, worsening lower extremity swelling, and they report her cough has somewhat improved. Just 2 days ago the patient was evaluated for chest pain complaints with CT angiogram of the chest which revealed possibilities of a pneumonia. Patient was continued on doxycycline which the family notes she's been on for total of approximately 6 days now for presumed pneumonia.   ED Course: Upon admission patient was seen to be afebrile, pulse 69-99, respirations 22-30, blood pressure is maintained, O2 saturation 94-100%. Lab work revealed BNP of 102.1, and otherwise was relatively unremarkable from patient's baseline. Chest x-ray showing bilateral pleural effusions.  CT scan of the head showed no acute abnormalities.  Review of Systems: As per HPI otherwise 10 point review of systems negative.   Past Medical History:  Diagnosis Date  . Cancer [facility])    Bladder  . Hiatal hernia   . High cholesterol   . HOH (hard of hearing)   . Hypertension   . Legally blind   . PE (pulmonary thromboembolism) ([place]) 09/2016  . Sinus drainage     Past Surgical History:  Procedure Laterality Date  . BLADDER REMOVAL  Nov. 1993   part    . DILATION AND CURETTAGE OF UTERUS     50+ years     reports that she has quit smoking. She has quit using smokeless tobacco. Her smokeless tobacco use included Snuff. She reports that she does not drink alcohol or use drugs.  Allergies  Allergen Reactions  . Tape Other (See Comments)    SKIN IS VERY THIN; TAPE WILL TEAR AND BRUISE THE PATIENT'S SKIN!! MUST USE PAPER TAPE  . Keflet [Cephalexin] Rash  . Sulfa Antibiotics Rash    Family History  Problem Relation Age of Onset  . Cancer Father   . Cancer Sister   . Cancer Brother     Prior to Admission medications   Medication Sig Start Date End Date Taking? Authorizing Provider  acetaminophen (TYLENOL) 325 MG tablet Take 2 tablets (650 mg total) by mouth every 6 (six) hours as needed for mild pain (or Fever >/= 101). 09/30/16   Robbie Lis, MD  alendronate (FOSAMAX) 70 MG tablet Take 70 mg by mouth every Tuesday. Take usually on Mondays or Tuesdays 07/25/16   Historical Provider, MD  apixaban (ELIQUIS) 5 MG TABS tablet Take 1 tablet (5 mg total) by mouth 2 (two) times daily. Patient taking differently: Take 2.5 mg by mouth 2 (two) times daily.  10/07/16   Robbie Lis, MD  Calcium Carbonate (CALCIUM 600 PO) Take 1 tablet by mouth daily.     Historical Provider, MD  Cholecalciferol (VITAMIN D-3) 1000 units CAPS Take 1 capsule by mouth daily.    Historical Provider, MD  doxycycline (VIBRAMYCIN) 100 MG capsule Take 100 mg by mouth 2 (two) times daily. 11/12/16   Historical Provider, MD  HYDROcodone-acetaminophen (NORCO/VICODIN) 5-325 MG tablet Take 0.5 tablets by mouth at bedtime as needed for pain. 11/10/16   Historical Provider, MD  naphazoline-pheniramine (NAPHCON-A) 0.025-0.3 % ophthalmic solution Place 1 drop into both eyes 4 (four) times daily as needed for irritation or allergies.     Historical Provider, MD  omega-3 acid ethyl esters (LOVAZA) 1 g capsule Take 1 g by mouth daily.    Historical Provider, MD  senna (SENOKOT) 8.6 MG  TABS tablet Take 1 tablet (8.6 mg total) by mouth at bedtime. 09/30/16   Robbie Lis, MD  sucralfate (CARAFATE) 1 g tablet Take 1 g by mouth 2 (two) times daily. 11/10/16   Historical Provider, MD  triamcinolone cream (KENALOG) 0.1 % Apply 1 application topically daily as needed (rash). TO LEGS 07/28/16   Historical Provider, MD    Physical Exam:   Constitutional:  [place] female in NAD, calm, comfortable Vitals:   11/19/16 2030 11/19/16 2100 11/19/16 2130 11/19/16 2231  BP: 172/67 158/77 155/74 152/74  Pulse: 74 99    Resp: 25 22 25    Temp:      TempSrc:      SpO2: 97% 95%    Weight:      Height:       Eyes: PERRL, lids and conjunctivae normal ENMT: Hard of heariMucous membranes are dry. Posterior pharynx clear of any exudate or lesions.  Neck: normal, supple, no masses, no thyromegaly Respiratory: clear to auscultation bilaterally, no wheezing, no crackles. Normal respiratory effort. No accessory muscle use.  Cardiovascular: Regular rate and rhythm, no murmurs / rubs / gallops. +2 lower extremity edema. 2+ pedal pulses. No carotid bruits.  Abdomen: no tenderness, no masses palpated. No hepatosplenomegaly. Bowel sounds positive.  Musculoskeletal: no clubbing / cyanosis. No joint deformity upper and lower extremities. Good ROM, no contractures. Normal muscle tone.  Skin: Erythema noted at the right lower extremity with increased warmth. Weeping serosanguineous fluid.   Neurologic: CN 2-12 grossly intact. Sensation intact, DTR normal. Strength 5/5 in all 4.  Psychiatric: Normal judgment and insight. Alert and oriented x 3. Normal mood.     Labs on Admission: I have personally reviewed following labs and imaging studies  CBC:  Recent Labs Lab 11/17/16 1916 11/19/16 2004  WBC 11.8* 6.8  NEUTROABS  --  4.1  HGB 11.3* 11.5*  HCT 37.9 38.5  MCV 79.0 79.1  PLT 321 062   Basic Metabolic Panel:  Recent Labs Lab 11/17/16 1916 11/19/16 2004  NA 131* 133*  K 3.8 4.3  CL  94* 101  CO2 27 25  GLUCOSE 140* 104*  BUN 11 9  CREATININE 0.84 0.79  CALCIUM 9.4 9.5   GFR: Estimated Creatinine Clearance: 33.8 mL/min (by C-G formula based on SCr of 0.79 mg/dL). Liver Function Tests:  Recent Labs Lab 11/19/16 2004  AST 21  ALT 10*  ALKPHOS 76  BILITOT 0.4  PROT 6.0*  ALBUMIN 2.5*   No results for input(s): LIPASE, AMYLASE in the last 168 hours. No results for input(s): AMMONIA in the last 168 hours. Coagulation Profile:  Recent Labs Lab 11/17/16 1916  INR 1.44   Cardiac Enzymes:  Recent Labs Lab 11/19/16 2004  TROPONINI <0.03   BNP (last 3 results) No results for input(s): PROBNP in the last 8760 hours. HbA1C: No results for input(s): HGBA1C in the last 72 hours. CBG:  Recent  Labs Lab 11/19/16 2005  GLUCAP 102*   Lipid Profile: No results for input(s): CHOL, HDL, LDLCALC, TRIG, CHOLHDL, LDLDIRECT in the last 72 hours. Thyroid Function Tests: No results for input(s): TSH, T4TOTAL, FREET4, T3FREE, THYROIDAB in the last 72 hours. Anemia Panel: No results for input(s): VITAMINB12, FOLATE, FERRITIN, TIBC, IRON, RETICCTPCT in the last 72 hours. Urine analysis:    Component Value Date/Time   COLORURINE YELLOW 11/19/2016 2053   APPEARANCEUR CLEAR 11/19/2016 2053   LABSPEC 1.015 11/19/2016 2053   PHURINE 6.0 11/19/2016 2053   GLUCOSEU NEGATIVE 11/19/2016 2053   HGBUR SMALL (A) 11/19/2016 2053   BILIRUBINUR NEGATIVE 11/19/2016 2053   KETONESUR NEGATIVE 11/19/2016 2053   PROTEINUR NEGATIVE 11/19/2016 2053   UROBILINOGEN 1.0 07/22/2015 1210   NITRITE NEGATIVE 11/19/2016 2053   LEUKOCYTESUR NEGATIVE 11/19/2016 2053   Sepsis Labs: No results found for this or any previous visit (from the past 240 hour(s)).   Radiological Exams on Admission: Ct Head Wo Contrast  Result Date: 11/19/2016 CLINICAL DATA:  Fall.  History of hypertension and cancer. EXAM: CT HEAD WITHOUT CONTRAST TECHNIQUE: Contiguous axial images were obtained from the  base of the skull through the vertex without intravenous contrast. COMPARISON:  08/15/2016 FINDINGS: Brain: Diffuse cerebral atrophy. Ventricular dilatation consistent with central atrophy. Low-attenuation changes throughout the deep white matter consistent with small vessel ischemia. No mass effect or midline shift. No abnormal extra-axial fluid collections. Gray-white matter junctions are distinct. Basal cisterns are not effaced. No acute intracranial hemorrhage. Vascular: Vascular calcifications are present in the carotid siphons and vertebrobasilar vessels. Skull: Calvarium appears intact. Sinuses/Orbits: Visualized paranasal sinuses and mastoid air cells are not opacified. Other: Small subcutaneous scalp hematoma over the left anterior frontal region. IMPRESSION: No acute intracranial abnormalities. Chronic atrophy and small vessel ischemic changes. Electronically Signed   By: Lucienne Capers M.D.   On: 11/19/2016 22:09   Dg Chest Port 1 View  Result Date: 11/19/2016 CLINICAL DATA:  83 year old female with shortness of breath. EXAM: PORTABLE CHEST 1 VIEW COMPARISON:  Chest CT dated 11/17/2016 FINDINGS: There bilateral pleural effusions with bibasilar compressive atelectasis versus infiltrate. Curvilinear density extending from the left hilum may represent atelectasis or scarring. There is no pneumothorax. The cardiac silhouette is within normal limits. The aorta is tortuous. There is atherosclerotic calcification of the thoracic aorta. There is osteopenia with old healed fracture deformity of the right humeral neck. No acute fracture identified. IMPRESSION: 1. Bilateral pleural effusions with bibasilar atelectasis versus infiltrate. 2. Aortic atherosclerosis. Electronically Signed   By: Anner Crete M.D.   On: 11/19/2016 20:35    EKG: Independently reviewed. Sinus rhythm with multiple PVCs  Assessment/Plan Failure to thrive: Family notes recent decline and patient overall status with decreased  appetite and generalized weakness. Question of possibility of underlying infection versus possibility of medication side effect. - Admit to MedSurg bed - Discontinue doxycycline - Check prealbumin in a.m. - Physical therapy to eval and treat - Social work Surveyor, minerals   Possibility of cellulitis of the right lower extremity: Acute. - Area around right lower extremity to be marked - added on CRP - Clindamycin IV  Bilateral pleural effusions and lower extremity edema: Acutely worsen. Initial BNP 102.1. Question diastolic congestive heart failure exacerbation vs third spacing secondary to malnutrition. Last echocardiogram done on 09/2016 with EF 70% with grade 2dFx. - Continuous pulse oximetry with nasal cannula oxygen as needed to keep O2 saturations >92% - Strict I&Os and daily weights - Elevate lower extremities -  Lasix 20 mg IV 1 dose - Reassess in a.m. to determine if further diuresis as needed.  Recent diagnosis of pneumonia: Patient had a CT angiogram on approximate 2 days ago by signs of a left sided pneumonia. Patient had been taking doxycycline for the last 6 days per family. - Patient currently on antibiotics of clindamycin as seen above. Lower suspicion for atypical pna given recent use of doxycycline.  History of pulmonary embolus - Continue Eliquis  Anemia of chronic disease  - continue to monitor   GERD - Continue Carafate  DVT prophylaxis:  Eliquis Code Status: DNR/DNI Family Communication: Discussed overall plan of care with patient and family present at bedside Disposition Plan: TBD Consults called: none Admission status: Observation  Norval Morton MD [facility] Pager [phone]  If 7PM-7AM, please contact night-coverage [email or web address] Password [facility]  11/19/2016, 10:53 PM

## 2016-11-20 DIAGNOSIS — I5033 Acute on chronic diastolic (congestive) heart failure: Secondary | ICD-10-CM | POA: Diagnosis not present

## 2016-11-20 DIAGNOSIS — R627 Adult failure to thrive: Secondary | ICD-10-CM

## 2016-11-20 DIAGNOSIS — K219 Gastro-esophageal reflux disease without esophagitis: Secondary | ICD-10-CM | POA: Diagnosis present

## 2016-11-20 DIAGNOSIS — W19XXXA Unspecified fall, initial encounter: Secondary | ICD-10-CM | POA: Diagnosis not present

## 2016-11-20 DIAGNOSIS — L03115 Cellulitis of right lower limb: Secondary | ICD-10-CM

## 2016-11-20 DIAGNOSIS — D638 Anemia in other chronic diseases classified elsewhere: Secondary | ICD-10-CM | POA: Diagnosis present

## 2016-11-20 LAB — BASIC METABOLIC PANEL
ANION GAP: 7 (ref 5–15)
BUN: 8 mg/dL (ref 6–20)
CHLORIDE: 102 mmol/L (ref 101–111)
CO2: 26 mmol/L (ref 22–32)
Calcium: 9.2 mg/dL (ref 8.9–10.3)
Creatinine, Ser: 0.74 mg/dL (ref 0.44–1.00)
GFR calc Af Amer: >60 mL/min (ref >60)
GFR calc non Af Amer: >60 mL/min (ref >60)
GLUCOSE: 97 mg/dL (ref 65–99)
POTASSIUM: 3.6 mmol/L (ref 3.5–5.1)
Sodium: 135 mmol/L (ref 135–145)

## 2016-11-20 LAB — CBC WITH DIFFERENTIAL/PLATELET
BASOS PCT: 0 %
Basophils Absolute: 0 10*3/uL (ref 0.0–0.1)
EOS ABS: 0.2 10*3/uL (ref 0.0–0.7)
Eosinophils Relative: 3 %
HCT: 37.9 % (ref 36.0–46.0)
HEMOGLOBIN: 11.4 g/dL — AB (ref 12.0–15.0)
Lymphocytes Relative: 28 %
Lymphs Abs: 2.2 10*3/uL (ref 0.7–4.0)
MCH: 23.5 pg — ABNORMAL LOW (ref 26.0–34.0)
MCHC: 30.1 g/dL (ref 30.0–36.0)
MCV: 78.1 fL (ref 78.0–100.0)
MONOS PCT: 11 %
Monocytes Absolute: 0.9 10*3/uL (ref 0.1–1.0)
NEUTROS PCT: 58 %
Neutro Abs: 4.5 10*3/uL (ref 1.7–7.7)
Platelets: 356 10*3/uL (ref 150–400)
RBC: 4.85 MIL/uL (ref 3.87–5.11)
RDW: 18 % — AB (ref 11.5–15.5)
WBC: 7.8 10*3/uL (ref 4.0–10.5)

## 2016-11-20 LAB — PREALBUMIN: Prealbumin: 9 mg/dL — ABNORMAL LOW (ref 18–38)

## 2016-11-20 LAB — TROPONIN I
Troponin I: <0.03 ng/mL (ref <0.03)
Troponin I: <0.03 ng/mL (ref <0.03)

## 2016-11-20 LAB — C-REACTIVE PROTEIN: CRP: 6.2 mg/dL — ABNORMAL HIGH (ref <1.0)

## 2016-11-20 MED ORDER — CLINDAMYCIN PHOSPHATE 600 MG/50ML IV SOLN
600.0000 mg | Freq: Three times a day (TID) | INTRAVENOUS | Status: DC
  Administered 2016-11-20 – 2016-11-23 (×11): 600 mg via INTRAVENOUS
  Filled 2016-11-20 (×11): qty 50

## 2016-11-20 MED ORDER — RISAQUAD PO CAPS
2.0000 | ORAL_CAPSULE | Freq: Three times a day (TID) | ORAL | Status: DC
  Administered 2016-11-20 – 2016-11-24 (×13): 2 via ORAL
  Filled 2016-11-20 (×13): qty 2

## 2016-11-20 MED ORDER — LEVALBUTEROL HCL 0.63 MG/3ML IN NEBU: 0.6300 mg | INHALATION_SOLUTION | Freq: Four times a day (QID) | RESPIRATORY_TRACT | Status: DC | PRN

## 2016-11-20 NOTE — ED Notes (Signed)
No lab draw,  Pt enroute to inpatient floor.

## 2016-11-20 NOTE — Evaluation (Signed)
Physical Therapy Evaluation Patient Details Name: Savannah Parks MRN: [id number] DOB: [birth year] Today's Date: 11/20/2016   History of Present Illness  Pt adm with weakness and FTT. Pt with possible PNA. Pt with fall prior to admission. PMH - rt humeral fx 12/17, PE 09/2016, HTN, bladder CA, HOH, legally blind.  Clinical Impression  Pt admitted with above diagnosis and presents to PT with functional limitations due to deficits listed below (See PT problem list). Pt needs skilled PT to maximize independence and safety to allow discharge to home with family if family able to continue to provide physical assist with all mobility.      Follow Up Recommendations Home health PT;Supervision/Assistance - 24 hour (if family able to provide 24 hour assist)    Equipment Recommendations  None recommended by PT    Recommendations for Other Services       Precautions / Restrictions Precautions Precautions: Fall Restrictions Weight Bearing Restrictions: No      Mobility  Bed Mobility Overal bed mobility: Needs Assistance Bed Mobility: Supine to Sit     Supine to sit: Min assist     General bed mobility comments: assist to elevate trunk into sitting and bringing hips to EOB  Transfers Overall transfer level: Needs assistance Equipment used: None (bil shelf arm support) Transfers: Sit to/from [facility] Sit to Stand: Mod assist Stand pivot transfers: Min assist       General transfer comment: Assist to bring hips up and for balance. Pt holding onto my forearms and taking pivotal steps from bed to chair  Ambulation/Gait                Stairs            Wheelchair Mobility    Modified Rankin (Stroke Patients Only)       Balance                                             Pertinent Vitals/Pain Pain Assessment: No/denies pain    Home Living Family/patient expects to be discharged to:: Private residence Living Arrangements:  Children Available Help at Discharge: Family;Available 24 hours/day Type of Home: House Home Access: Stairs to enter Entrance Stairs-Rails: None Entrance Stairs-Number of Steps: 1 Home Layout: One level Home Equipment: Walker - 4 wheels      Prior Function Level of Independence: Needs assistance   Gait / Transfers Assistance Needed: Pt reports family assist pt with bed to chair transfers. Reports she isn't amb with family           Hand Dominance   Dominant Hand: Right    Extremity/Trunk Assessment   Upper Extremity Assessment Upper Extremity Assessment: RUE deficits/detail;Generalized weakness RUE Deficits / Details: Decr strength and ROM due to recent fx    Lower Extremity Assessment Lower Extremity Assessment: Generalized weakness       Communication   Communication: HOH  Cognition Arousal/Alertness: Awake/alert Behavior During Therapy: WFL for tasks assessed/performed Overall Cognitive Status: Within Functional Limits for tasks assessed                      General Comments      Exercises     Assessment/Plan    PT Assessment Patient needs continued PT services  PT Problem List Decreased strength;Decreased activity tolerance;Decreased balance;Decreased mobility       PT Treatment Interventions  DME instruction;Gait training;Functional mobility training;Therapeutic activities;Therapeutic exercise;Balance training;Patient/family education    PT Goals (Current goals can be found in the Care Plan section)  Acute Rehab PT Goals Patient Stated Goal: return home PT Goal Formulation: With patient Time For Goal Achievement: 11/27/16 Potential to Achieve Goals: Good    Frequency Min 3X/week   Barriers to discharge        Co-evaluation               End of Session Equipment Utilized During Treatment: Gait belt Activity Tolerance: Patient tolerated treatment well Patient left: in chair;with call bell/phone within reach;with chair alarm  set Nurse Communication: Mobility status PT Visit Diagnosis: Muscle weakness (generalized) (M62.81);Difficulty in walking, not elsewhere classified (R26.2)         Time: 1205-1227 PT Time Calculation (min) (ACUTE ONLY): 22 min   Charges:   PT Evaluation $PT Eval Moderate Complexity: 1 Procedure     PT G CodesShary Decamp Parks 16-Dec-2016, 2:07 PM [employer] PT [phone]

## 2016-11-20 NOTE — Care Management Note (Signed)
Case Management Note  Patient Details  Name: Savannah Parks MRN: [id number] Date of Birth: 06-16-1911  Subjective/Objective:   Admitted with Failure to Thrive, Pneumonia; CM following for DCP;        Action/Plan: PCP: Hayden Rasmussen., MD; has private insurance with [facility] with prescription drug coverage;    10/01/2016 - PTA pt was at home with daughter- was active with [facility] for HHRN/PT services (confirmed with [facility] at [place])- per CSW note- daughter interested in taking pt back home with [facility] services- will need resumption orders for discharge. Kenn File RN,CM        Expected Discharge Date:    possibly 11/25/2016              Expected Discharge Plan:  [place]  Status of Service:  In process, will continue to follow  Sherrilyn Rist [phone] 11/20/2016, 1:26 PM

## 2016-11-20 NOTE — Progress Notes (Signed)
Patient slept for the rest of the night. Complained of generalized pain, PRN given and effective. No other complaints and issues. Will continue to monitor.  Julia Kulzer, RN

## 2016-11-20 NOTE — Progress Notes (Signed)
Physical Therapy Treatment Patient Details Name: Savannah Parks MRN: [id number] DOB: 09/07/1911 Today's Date: 11/20/2016    History of Present Illness Pt adm with weakness and FTT. Pt with possible PNA. Pt with fall prior to admission. PMH - rt humeral fx 12/17, PE 09/2016, HTN, bladder CA, HOH, legally blind.    PT Comments    Pt progressing with transfers.   Follow Up Recommendations  Home health PT;Supervision/Assistance - 24 hour (if family able to provide 24 hour assist)     Equipment Recommendations  None recommended by PT    Recommendations for Other Services       Precautions / Restrictions Precautions Precautions: Fall Restrictions Weight Bearing Restrictions: No    Mobility  Bed Mobility Overal bed mobility: Needs Assistance Bed Mobility: Sit to Supine     Sit to supine: Min assist   General bed mobility comments: assist to bring legs back up into bed  Transfers Overall transfer level: Needs assistance Equipment used: None (bil shelf arm support) Transfers: Sit to/from [facility] Sit to Stand: Min assist Stand pivot transfers: Min assist       General transfer comment: Assist to bring hips up and for balance. Pt holding onto my forearms and taking pivotal steps from chair to bed  Ambulation/Gait                 Stairs            Wheelchair Mobility    Modified Rankin (Stroke Patients Only)       Balance Overall balance assessment: Needs assistance Sitting-balance support: Bilateral upper extremity supported;Feet supported Sitting balance-Leahy Scale: Poor Sitting balance - Comments: UE support  Postural control: Right lateral lean Standing balance support: Bilateral upper extremity supported Standing balance-Leahy Scale: Poor Standing balance comment: Stood x 1 minute with bil shelf arm support.                     Cognition Arousal/Alertness: Awake/alert Behavior During Therapy: WFL for tasks  assessed/performed Overall Cognitive Status: Within Functional Limits for tasks assessed                      Exercises      General Comments        Pertinent Vitals/Pain Pain Assessment: No/denies pain    Home Living Family/patient expects to be discharged to:: Private residence Living Arrangements: Children Available Help at Discharge: Family;Available 24 hours/day Type of Home: House Home Access: Stairs to enter Entrance Stairs-Rails: None Home Layout: One level Home Equipment: Walker - 4 wheels      Prior Function Level of Independence: Needs assistance  Gait / Transfers Assistance Needed: Pt reports family assist pt with bed to chair transfers. Reports she isn't amb with family       PT Goals (current goals can now be found in the care plan section) Acute Rehab PT Goals Patient Stated Goal: return home PT Goal Formulation: With patient Time For Goal Achievement: 11/27/16 Potential to Achieve Goals: Good Progress towards PT goals: Progressing toward goals    Frequency    Min 3X/week      PT Plan Current plan remains appropriate    Co-evaluation             End of Session Equipment Utilized During Treatment: Gait belt Activity Tolerance: Patient tolerated treatment well Patient left: with call bell/phone within reach;in bed;with bed alarm set;with family/visitor present Nurse Communication: Mobility status PT Visit Diagnosis: Muscle  weakness (generalized) (M62.81);Difficulty in walking, not elsewhere classified (R26.2)     Time: 1478-2956 PT Time Calculation (min) (ACUTE ONLY): 12 min  Charges:  $Therapeutic Activity: 8-22 mins                    G CodesShary Decamp [facility] 12-03-2016, 2:56 PM [employer] PT [phone]

## 2016-11-20 NOTE — Care Management Obs Status (Signed)
[facility] NOTIFICATION   Patient Details  Name: Savannah Parks MRN: [id number] Date of Birth: December 03, 1910   Medicare Observation Status Notification Given:  Yes    Royston Bake, RN 11/20/2016, 2:04 PM

## 2016-11-20 NOTE — Progress Notes (Signed)
PROGRESS NOTE    Savannah Parks  OEV:[id number] DOB: 11-19-1910 DOA: 11/19/2016 PCP: Hayden Rasmussen., MD    Brief Narrative:  83 y.o. female with medical history significant of HTN, diastolic CHF last EF 82-99% with grade 2 dFx on 09/2016, HOH, legally blind, PE on Eliquis; resents with complaints of generalized weakness. History is obtained with the assistance of family who are present at bedside. The patient legs apparently gave way today and she was guided down to the floor. She was noted to have hit her forehead, but was not noted to have loss consciousness. Last few days family notes that the patient has had decreased overall appetite, complained of generalized pain all over, worsening lower extremity swelling, and they report her cough has somewhat improved. Just 2 days ago the patient was evaluated for chest pain complaints with CT angiogram of the chest which revealed possibilities of a pneumonia. Patient was continued on doxycycline which the family notes she's been on for total of approximately 6 days now for presumed pneumonia.  Assessment & Plan:   Principal Problem:   Failure to thrive in adult Active Problems:   Diastolic CHF, acute on chronic (HCC)   Cellulitis of leg, right   Anemia of chronic disease   GERD (gastroesophageal reflux disease)   Failure to thrive: Family notes recent decline and patient overall status with decreased appetite and generalized weakness. Question of possibility of underlying infection versus possibility of medication side effect. - Prealbumin low at 9.0 - Physical therapy consulted   Possibility of cellulitis of the right lower extremity: Acute. - Area around right lower extremity to be marked - Now on clindamycin IV  Bilateral pleural effusions and lower extremity edema: Acutely worsened on admission. Initial BNP 102.1. Suspecting diastolic congestive heart failure exacerbation vs third spacing secondary to malnutrition. Last echocardiogram  done on 09/2015 with EF 70% with grade 2dFx. - Continue strict I&Os - Wt on admit: 72.6kg - Wt on 09/28/16: 72.8kg - Wt today: 71.9kg - Elevate lower extremities as tolerated - Patient was given one dose of IV lasix on admission  Recent diagnosis of pneumonia: Patient had a CT angiogram on approximate 2 days prior to admission with evidence of left sided pneumonia. Patient had been taking doxycycline for the last 6 days prior to admission. - Patient now on antibiotics of clindamycin as seen above. Lower suspicion for atypical pna given recent use of doxycycline.  History of pulmonary embolus - Continue Eliquis as tolerated  Anemia of chronic disease  - continue to monitor  - Stable at this time  GERD - Continue Carafate - Currently stable  DVT prophylaxis: Eliquis Code Status: DNR Family Communication: Pt in room, family not at bedside Disposition Plan: Uncertain at this time  Consultants:     Procedures:     Antimicrobials: Anti-infectives    Start     Dose/Rate Route Frequency Ordered Stop   11/20/16 0015  clindamycin (CLEOCIN) IVPB 600 mg     600 mg 100 mL/hr over 30 Minutes Intravenous Every 8 hours 11/20/16 0001     11/20/16 0000  clindamycin (CLEOCIN) IVPB 600 mg  Status:  Discontinued     600 mg 100 mL/hr over 30 Minutes Intravenous Every 8 hours 11/19/16 2356 11/19/16 2358       Subjective: No complaints today  Objective: Vitals:   11/20/16 0130 11/20/16 0500 11/20/16 0911 11/20/16 1130  BP: (!) 168/86 (!) 162/80 (!) 148/60 (!) 154/92  Pulse: 91 84 91 85  Resp: (!)  22 20  20   Temp: 97.5 F (36.4 C) 97.3 F (36.3 C) 97.5 F (36.4 C) 97.6 F (36.4 C)  TempSrc: Oral Oral Oral Oral  SpO2: 96% 97% 95% 95%  Weight: 71.9 kg (158 lb 9.6 oz)     Height: 5\' 4"  (1.626 m)       Intake/Output Summary (Last 24 hours) at 11/20/16 1256 Last data filed at 11/20/16 0640  Gross per 24 hour  Intake              150 ml  Output                0 ml  Net               150 ml   Filed Weights   11/19/16 1947 11/20/16 0130  Weight: 72.6 kg (160 lb) 71.9 kg (158 lb 9.6 oz)    Examination:  General exam: Appears calm and comfortable  Respiratory system: Clear to auscultation. Respiratory effort normal. Cardiovascular system: S1 & S2 heard Gastrointestinal system: Abdomen is nondistended, soft and nontender. No organomegaly or masses felt. Normal bowel sounds heard. Central nervous system: Alert and oriented. No focal neurological deficits. Extremities: Symmetric 5 x 5 power. Skin: No rashes, LE shins with bandages B that are clean and intact Psychiatry: Judgement and insight appear normal. Mood & affect appropriate.   Data Reviewed: I have personally reviewed following labs and imaging studies  CBC:  Recent Labs Lab 11/17/16 1916 11/19/16 2004 11/20/16 0514  WBC 11.8* 6.8 7.8  NEUTROABS  --  4.1 4.5  HGB 11.3* 11.5* 11.4*  HCT 37.9 38.5 37.9  MCV 79.0 79.1 78.1  PLT 321 324 315   Basic Metabolic Panel:  Recent Labs Lab 11/17/16 1916 11/19/16 2004 11/20/16 0514  NA 131* 133* 135  K 3.8 4.3 3.6  CL 94* 101 102  CO2 27 25 26   GLUCOSE 140* 104* 97  BUN 11 9 8   CREATININE 0.84 0.79 0.74  CALCIUM 9.4 9.5 9.2   GFR: Estimated Creatinine Clearance: 33.6 mL/min (by C-G formula based on SCr of 0.74 mg/dL). Liver Function Tests:  Recent Labs Lab 11/19/16 2004  AST 21  ALT 10*  ALKPHOS 76  BILITOT 0.4  PROT 6.0*  ALBUMIN 2.5*   No results for input(s): LIPASE, AMYLASE in the last 168 hours. No results for input(s): AMMONIA in the last 168 hours. Coagulation Profile:  Recent Labs Lab 11/17/16 1916  INR 1.44   Cardiac Enzymes:  Recent Labs Lab 11/19/16 2004 11/20/16 0216 11/20/16 0514 11/20/16 1127  TROPONINI <0.03 <0.03 <0.03 <0.03   BNP (last 3 results) No results for input(s): PROBNP in the last 8760 hours. HbA1C: No results for input(s): HGBA1C in the last 72 hours. CBG:  Recent Labs Lab  11/19/16 2005  GLUCAP 102*   Lipid Profile: No results for input(s): CHOL, HDL, LDLCALC, TRIG, CHOLHDL, LDLDIRECT in the last 72 hours. Thyroid Function Tests: No results for input(s): TSH, T4TOTAL, FREET4, T3FREE, THYROIDAB in the last 72 hours. Anemia Panel: No results for input(s): VITAMINB12, FOLATE, FERRITIN, TIBC, IRON, RETICCTPCT in the last 72 hours. Sepsis Labs: No results for input(s): PROCALCITON, LATICACIDVEN in the last 168 hours.  No results found for this or any previous visit (from the past 240 hour(s)).   Radiology Studies: Ct Head Wo Contrast  Result Date: 11/19/2016 CLINICAL DATA:  Fall.  History of hypertension and cancer. EXAM: CT HEAD WITHOUT CONTRAST TECHNIQUE: Contiguous axial images were obtained from the  base of the skull through the vertex without intravenous contrast. COMPARISON:  08/15/2016 FINDINGS: Brain: Diffuse cerebral atrophy. Ventricular dilatation consistent with central atrophy. Low-attenuation changes throughout the deep white matter consistent with small vessel ischemia. No mass effect or midline shift. No abnormal extra-axial fluid collections. Gray-white matter junctions are distinct. Basal cisterns are not effaced. No acute intracranial hemorrhage. Vascular: Vascular calcifications are present in the carotid siphons and vertebrobasilar vessels. Skull: Calvarium appears intact. Sinuses/Orbits: Visualized paranasal sinuses and mastoid air cells are not opacified. Other: Small subcutaneous scalp hematoma over the left anterior frontal region. IMPRESSION: No acute intracranial abnormalities. Chronic atrophy and small vessel ischemic changes. Electronically Signed   By: Lucienne Capers M.D.   On: 11/19/2016 22:09   Dg Chest Port 1 View  Result Date: 11/19/2016 CLINICAL DATA:  83 year old female with shortness of breath. EXAM: PORTABLE CHEST 1 VIEW COMPARISON:  Chest CT dated 11/17/2016 FINDINGS: There bilateral pleural effusions with bibasilar compressive  atelectasis versus infiltrate. Curvilinear density extending from the left hilum may represent atelectasis or scarring. There is no pneumothorax. The cardiac silhouette is within normal limits. The aorta is tortuous. There is atherosclerotic calcification of the thoracic aorta. There is osteopenia with old healed fracture deformity of the right humeral neck. No acute fracture identified. IMPRESSION: 1. Bilateral pleural effusions with bibasilar atelectasis versus infiltrate. 2. Aortic atherosclerosis. Electronically Signed   By: Anner Crete M.D.   On: 11/19/2016 20:35    Scheduled Meds: . acidophilus  2 capsule Oral TID  . apixaban  2.5 mg Oral BID  . calcium carbonate  500 mg of elemental calcium Oral Q breakfast  . clindamycin (CLEOCIN) IV  600 mg Intravenous Q8H  . senna  1 tablet Oral QHS  . sodium chloride flush  3 mL Intravenous Q12H  . sucralfate  1 g Oral BID   Continuous Infusions:   LOS: 0 days   Damar Petit, Orpah Melter, MD [facility] Pager [phone]  If 7PM-7AM, please contact night-coverage [email or web address] Password [facility] 11/20/2016, 12:56 PM

## 2016-11-20 NOTE — Progress Notes (Signed)
New Admission Note:   Arrival Method: Bed Mental Orientation: A&Ox4 Telemetry:3e41 Assessment: Completed Safety Measures: Safety Fall Prevention Plan has been discussed  3 [place] Orientation: Patient has been orientated to the room, unit and staff.  Family: none at bedside  Orders to be reviewed and implemented. Will continue to monitor the patient. Call light has been placed within reach and bed alarm has been activated.   Venetia Night, RN Phone: [phone]

## 2016-11-20 NOTE — Consult Note (Signed)
[place] Nurse wound consult note Reason for Consult: Cellulitis of R leg Wound type:intact blisters Pressure Injury POA: NA Measurement: RLE 10cm in length that circles the entire calf                          LLE  8cm in length that circles the entire calf Wound bed: intact blisters Drainage (amount, consistency, odor) none at this times Periwound: reddened, cellulitis Dressing procedure/placement/frequency: I have provided nurses with orders for Cleanse with NS, gently pat dry, apply Xeroform gauze, wrap with kerlix, change daily. We will not follow, but will remain available to this patient, to nursing, and the medical and/or surgical teams.  Please re-consult if we need to assist further.    Fara Olden, RN-C, WTA-C Wound Treatment Associate

## 2016-11-20 NOTE — Progress Notes (Signed)
Advance Beneficiary Notice of Noncoverage (ABN) given to patient/ family members as directed by Med Director Dr Reynaldo Minium and UR Reviewer Rod Holler RN; Letter explained in its entirety with all questions answered. [place], MHA,BSN [phone]

## 2016-11-21 ENCOUNTER — Encounter (HOSPITAL_COMMUNITY): Payer: Medicare Other

## 2016-11-21 DIAGNOSIS — E7800 Pure hypercholesterolemia, unspecified: Secondary | ICD-10-CM | POA: Diagnosis present

## 2016-11-21 DIAGNOSIS — R531 Weakness: Secondary | ICD-10-CM | POA: Diagnosis present

## 2016-11-21 DIAGNOSIS — L03115 Cellulitis of right lower limb: Secondary | ICD-10-CM | POA: Diagnosis not present

## 2016-11-21 DIAGNOSIS — Y92009 Unspecified place in unspecified non-institutional (private) residence as the place of occurrence of the external cause: Secondary | ICD-10-CM | POA: Diagnosis not present

## 2016-11-21 DIAGNOSIS — K219 Gastro-esophageal reflux disease without esophagitis: Secondary | ICD-10-CM

## 2016-11-21 DIAGNOSIS — W1839XA Other fall on same level, initial encounter: Secondary | ICD-10-CM | POA: Diagnosis present

## 2016-11-21 DIAGNOSIS — Z66 Do not resuscitate: Secondary | ICD-10-CM | POA: Diagnosis present

## 2016-11-21 DIAGNOSIS — I5033 Acute on chronic diastolic (congestive) heart failure: Secondary | ICD-10-CM | POA: Diagnosis not present

## 2016-11-21 DIAGNOSIS — H548 Legal blindness, as defined in USA: Secondary | ICD-10-CM | POA: Diagnosis present

## 2016-11-21 DIAGNOSIS — Z7901 Long term (current) use of anticoagulants: Secondary | ICD-10-CM | POA: Diagnosis not present

## 2016-11-21 DIAGNOSIS — J189 Pneumonia, unspecified organism: Secondary | ICD-10-CM | POA: Diagnosis present

## 2016-11-21 DIAGNOSIS — W19XXXA Unspecified fall, initial encounter: Secondary | ICD-10-CM

## 2016-11-21 DIAGNOSIS — S0083XA Contusion of other part of head, initial encounter: Secondary | ICD-10-CM | POA: Diagnosis present

## 2016-11-21 DIAGNOSIS — Z87891 Personal history of nicotine dependence: Secondary | ICD-10-CM | POA: Diagnosis not present

## 2016-11-21 DIAGNOSIS — Z7983 Long term (current) use of bisphosphonates: Secondary | ICD-10-CM | POA: Diagnosis not present

## 2016-11-21 DIAGNOSIS — I110 Hypertensive heart disease with heart failure: Secondary | ICD-10-CM | POA: Diagnosis present

## 2016-11-21 DIAGNOSIS — Z86711 Personal history of pulmonary embolism: Secondary | ICD-10-CM | POA: Diagnosis not present

## 2016-11-21 DIAGNOSIS — R627 Adult failure to thrive: Secondary | ICD-10-CM | POA: Diagnosis not present

## 2016-11-21 DIAGNOSIS — L0390 Cellulitis, unspecified: Secondary | ICD-10-CM | POA: Diagnosis not present

## 2016-11-21 DIAGNOSIS — H9190 Unspecified hearing loss, unspecified ear: Secondary | ICD-10-CM | POA: Diagnosis present

## 2016-11-21 DIAGNOSIS — D638 Anemia in other chronic diseases classified elsewhere: Secondary | ICD-10-CM | POA: Diagnosis present

## 2016-11-21 DIAGNOSIS — Z79899 Other long term (current) drug therapy: Secondary | ICD-10-CM | POA: Diagnosis not present

## 2016-11-21 LAB — BASIC METABOLIC PANEL
ANION GAP: 4 — AB (ref 5–15)
BUN: 7 mg/dL (ref 6–20)
CALCIUM: 9.1 mg/dL (ref 8.9–10.3)
CO2: 29 mmol/L (ref 22–32)
Chloride: 102 mmol/L (ref 101–111)
Creatinine, Ser: 0.81 mg/dL (ref 0.44–1.00)
GFR calc non Af Amer: 56 mL/min — ABNORMAL LOW (ref >60)
Glucose, Bld: 92 mg/dL (ref 65–99)
Potassium: 3.7 mmol/L (ref 3.5–5.1)
SODIUM: 135 mmol/L (ref 135–145)

## 2016-11-21 MED ORDER — FUROSEMIDE 10 MG/ML IJ SOLN
40.0000 mg | Freq: Once | INTRAMUSCULAR | Status: AC
  Administered 2016-11-21: 40 mg via INTRAVENOUS
  Filled 2016-11-21: qty 4

## 2016-11-21 NOTE — Progress Notes (Signed)
MD called stated to give pt lasix due to worsening CXR. MD aware pt wearing 2 liter nasal cannula do to shortness of breath upon exertion. Will continue to monitor and wean as necessary   Loraine Bhullar Leory Plowman

## 2016-11-21 NOTE — Progress Notes (Signed)
PROGRESS NOTE    Savannah Parks  QXI:[id number] DOB: 1910/10/20 DOA: 11/19/2016 PCP: Hayden Rasmussen., MD    Brief Narrative:  83 y.o. female with medical history significant of HTN, diastolic CHF last EF 03-49% with grade 2 dFx on 09/2016, HOH, legally blind, PE on Eliquis; resents with complaints of generalized weakness. History is obtained with the assistance of family who are present at bedside. The patient legs apparently gave way today and she was guided down to the floor. She was noted to have hit her forehead, but was not noted to have loss consciousness. Last few days family notes that the patient has had decreased overall appetite, complained of generalized pain all over, worsening lower extremity swelling, and they report her cough has somewhat improved. Just 2 days ago the patient was evaluated for chest pain complaints with CT angiogram of the chest which revealed possibilities of a pneumonia. Patient was continued on doxycycline which the family notes she's been on for total of approximately 6 days now for presumed pneumonia.  Assessment & Plan:   Principal Problem:   Failure to thrive in adult Active Problems:   Diastolic CHF, acute on chronic (HCC)   Cellulitis of leg, right   Anemia of chronic disease   GERD (gastroesophageal reflux disease)   Failure to thrive: Family notes recent decline and patient overall status with decreased appetite and generalized weakness. Question of possibility of underlying infection versus possibility of medication side effect. - Prealbumin low at 9.0 - Physical therapy consulted, recommendations for home health PT  Possibility of cellulitis of the right lower extremity: Acute. - Area around right lower extremity to be marked - Patient is continued on clindamycin IV  Bilateral pleural effusions and lower extremity edema: Acutely worsened on admission. Initial BNP 102.1. Suspecting diastolic congestive heart failure exacerbation vs third  spacing secondary to malnutrition. Last echocardiogram done on 09/2015 with EF 70% with grade 2dFx. - Will continue strict I&Os - Wt on admit: 72.6kg - Wt on 09/28/16: 72.8kg - Wt today: 72.2kg - Patient was given one dose of IV lasix on admission - Patient remains o2 dependent. Recent CXR reviewed. B effusions are evident. Will order Lasix 40mg  IV starting now. Will choose IV lasix since there are signs of volume overload, thus it is expected that bowels are also edematous, thus absorption of PO lasix would be decreased, making PO lasix not effective  Recent diagnosis of pneumonia: Patient had a CT angiogram on approximate 2 days prior to admission with evidence of left sided pneumonia. Patient had been taking doxycycline for the last 6 days prior to admission. - Patient now on antibiotics of clindamycin as seen above. There is lower suspicion for atypical pna given recent use of doxycycline.  History of pulmonary embolus - Will continue Eliquis as tolerated - Stable at present  Anemia of chronic disease  - continue to monitor for now - Currently stable  GERD - Will continue Carafate - Currently stable  DVT prophylaxis: Eliquis Code Status: DNR Family Communication: Pt in room, family not at bedside Disposition Plan: Uncertain at this time  Consultants:     Procedures:     Antimicrobials: Anti-infectives    Start     Dose/Rate Route Frequency Ordered Stop   11/20/16 0015  clindamycin (CLEOCIN) IVPB 600 mg     600 mg 100 mL/hr over 30 Minutes Intravenous Every 8 hours 11/20/16 0001     11/20/16 0000  clindamycin (CLEOCIN) IVPB 600 mg  Status:  Discontinued  600 mg 100 mL/hr over 30 Minutes Intravenous Every 8 hours 11/19/16 2356 11/19/16 2358      Subjective: Without complaints today  Objective: Vitals:   11/21/16 0712 11/21/16 0742 11/21/16 0839 11/21/16 1245  BP: 128/80 (!) 159/70  130/61  Pulse: 80 73  78  Resp: 19   20  Temp: 98 F (36.7 C) 97.4 F  (36.3 C)  97.8 F (36.6 C)  TempSrc: Oral Oral  Oral  SpO2: 99% 98% 98% 97%  Weight: 72.2 kg (159 lb 3.2 oz)     Height:        Intake/Output Summary (Last 24 hours) at 11/21/16 1546 Last data filed at 11/21/16 1300  Gross per 24 hour  Intake              640 ml  Output                0 ml  Net              640 ml   Filed Weights   11/19/16 1947 11/20/16 0130 11/21/16 2409  Weight: 72.6 kg (160 lb) 71.9 kg (158 lb 9.6 oz) 72.2 kg (159 lb 3.2 oz)    Examination:  General exam: Laying in bed, in nad Respiratory system: normal resp effort, [place] in place, mildly coarse Cardiovascular system: regular rate, s1-2 Gastrointestinal system: soft, nondistended, pos BS Central nervous system: cn2-12 grossly intact, strength intact. Extremities: no clubbing, perfused Skin: normal skin turgor, 2+ pitting LE edema, healing LE cellulitis Psychiatry: mood normal// no visual hallucinations   Data Reviewed: I have personally reviewed following labs and imaging studies  CBC:  Recent Labs Lab 11/17/16 1916 11/19/16 2004 11/20/16 0514  WBC 11.8* 6.8 7.8  NEUTROABS  --  4.1 4.5  HGB 11.3* 11.5* 11.4*  HCT 37.9 38.5 37.9  MCV 79.0 79.1 78.1  PLT 321 324 735   Basic Metabolic Panel:  Recent Labs Lab 11/17/16 1916 11/19/16 2004 11/20/16 0514 11/21/16 0537  NA 131* 133* 135 135  K 3.8 4.3 3.6 3.7  CL 94* 101 102 102  CO2 27 25 26 29   GLUCOSE 140* 104* 97 92  BUN 11 9 8 7   CREATININE 0.84 0.79 0.74 0.81  CALCIUM 9.4 9.5 9.2 9.1   GFR: Estimated Creatinine Clearance: 33.3 mL/min (by C-G formula based on SCr of 0.81 mg/dL). Liver Function Tests:  Recent Labs Lab 11/19/16 2004  AST 21  ALT 10*  ALKPHOS 76  BILITOT 0.4  PROT 6.0*  ALBUMIN 2.5*   No results for input(s): LIPASE, AMYLASE in the last 168 hours. No results for input(s): AMMONIA in the last 168 hours. Coagulation Profile:  Recent Labs Lab 11/17/16 1916  INR 1.44   Cardiac Enzymes:  Recent Labs Lab  11/19/16 2004 11/20/16 0216 11/20/16 0514 11/20/16 1127  TROPONINI <0.03 <0.03 <0.03 <0.03   BNP (last 3 results) No results for input(s): PROBNP in the last 8760 hours. HbA1C: No results for input(s): HGBA1C in the last 72 hours. CBG:  Recent Labs Lab 11/19/16 2005  GLUCAP 102*   Lipid Profile: No results for input(s): CHOL, HDL, LDLCALC, TRIG, CHOLHDL, LDLDIRECT in the last 72 hours. Thyroid Function Tests: No results for input(s): TSH, T4TOTAL, FREET4, T3FREE, THYROIDAB in the last 72 hours. Anemia Panel: No results for input(s): VITAMINB12, FOLATE, FERRITIN, TIBC, IRON, RETICCTPCT in the last 72 hours. Sepsis Labs: No results for input(s): PROCALCITON, LATICACIDVEN in the last 168 hours.  No results found for this  or any previous visit (from the past 240 hour(s)).   Radiology Studies: Ct Head Wo Contrast  Result Date: 11/19/2016 CLINICAL DATA:  Fall.  History of hypertension and cancer. EXAM: CT HEAD WITHOUT CONTRAST TECHNIQUE: Contiguous axial images were obtained from the base of the skull through the vertex without intravenous contrast. COMPARISON:  08/15/2016 FINDINGS: Brain: Diffuse cerebral atrophy. Ventricular dilatation consistent with central atrophy. Low-attenuation changes throughout the deep white matter consistent with small vessel ischemia. No mass effect or midline shift. No abnormal extra-axial fluid collections. Gray-white matter junctions are distinct. Basal cisterns are not effaced. No acute intracranial hemorrhage. Vascular: Vascular calcifications are present in the carotid siphons and vertebrobasilar vessels. Skull: Calvarium appears intact. Sinuses/Orbits: Visualized paranasal sinuses and mastoid air cells are not opacified. Other: Small subcutaneous scalp hematoma over the left anterior frontal region. IMPRESSION: No acute intracranial abnormalities. Chronic atrophy and small vessel ischemic changes. Electronically Signed   By: Lucienne Capers M.D.   On:  11/19/2016 22:09   Dg Chest Port 1 View  Result Date: 11/19/2016 CLINICAL DATA:  83 year old female with shortness of breath. EXAM: PORTABLE CHEST 1 VIEW COMPARISON:  Chest CT dated 11/17/2016 FINDINGS: There bilateral pleural effusions with bibasilar compressive atelectasis versus infiltrate. Curvilinear density extending from the left hilum may represent atelectasis or scarring. There is no pneumothorax. The cardiac silhouette is within normal limits. The aorta is tortuous. There is atherosclerotic calcification of the thoracic aorta. There is osteopenia with old healed fracture deformity of the right humeral neck. No acute fracture identified. IMPRESSION: 1. Bilateral pleural effusions with bibasilar atelectasis versus infiltrate. 2. Aortic atherosclerosis. Electronically Signed   By: Anner Crete M.D.   On: 11/19/2016 20:35    Scheduled Meds: . acidophilus  2 capsule Oral TID  . apixaban  2.5 mg Oral BID  . calcium carbonate  500 mg of elemental calcium Oral Q breakfast  . clindamycin (CLEOCIN) IV  600 mg Intravenous Q8H  . furosemide  40 mg Intravenous Once  . senna  1 tablet Oral QHS  . sodium chloride flush  3 mL Intravenous Q12H  . sucralfate  1 g Oral BID   Continuous Infusions:   LOS: 0 days   Trevaun Rendleman, Orpah Melter, MD [facility] Pager [phone]  If 7PM-7AM, please contact night-coverage [email or web address] Password [facility] 11/21/2016, 3:46 PM

## 2016-11-21 NOTE — Progress Notes (Signed)
MD, pt's family wanted LE dopplers done due to recent hx of DVT, wanted to rule this out, Thanks Arvella Nigh RN

## 2016-11-21 NOTE — Progress Notes (Signed)
Physical Therapy Treatment Patient Details Name: Savannah Parks MRN: [id number] DOB: 1910/10/06 Today's Date: 11/21/2016    History of Present Illness Pt adm with weakness and FTT. Pt with possible PNA. Pt with fall prior to admission. PMH - rt humeral fx 12/17, PE 09/2016, HTN, bladder CA, HOH, legally blind.    PT Comments    Pt in bed with family visiting.  Assisted to sitting EOB.  Poor staitc sitting with noted L lean. Assisted pt OOB to [facility] required increased assist and time as pt reported she did feel well today.  Assisted with peri care as pt was able to static stand a good amount while holding to bed rail and commode armrest.  Pt did require increased assist to complete 1/4 turn from [facility] to bed.  Assisted back to bed L sidelying.     Follow Up Recommendations  Home health PT;Supervision/Assistance - 24 hour       Equipment Recommendations  None recommended by PT    Recommendations for Other Services       Precautions / Restrictions Precautions Precautions: Fall Precaution Comments: HOH, legally blind but can see shapes/light/dark Restrictions Weight Bearing Restrictions: No    Mobility  Bed Mobility Overal bed mobility: Needs Assistance Bed Mobility: Supine to Sit;Sit to Supine     Supine to sit: Mod assist Sit to supine: Mod assist;Max assist   General bed mobility comments: assisted OOB and back to bed  Transfers Overall transfer level: Needs assistance Equipment used: None Transfers: Stand Pivot Transfers Sit to Stand: Min assist;Mod assist Stand pivot transfers: Min assist;Mod assist       General transfer comment: assist to rise was Min Assist but then needed Mod Assist to complete 1/4 turn from bed to [facility] then back to bed.  Hand over hand cueing due to low vision.  Assisted with peri care after void.    Ambulation/Gait             General Gait Details: did not attempt due to no second person to assist   Stairs            Wheelchair  Mobility    Modified Rankin (Stroke Patients Only)       Balance                                    Cognition Arousal/Alertness: Awake/alert Behavior During Therapy: WFL for tasks assessed/performed Overall Cognitive Status: Within Functional Limits for tasks assessed                      Exercises      General Comments        Pertinent Vitals/Pain Pain Assessment: No/denies pain    Home Living                      Prior Function            PT Goals (current goals can now be found in the care plan section) Progress towards PT goals: Progressing toward goals    Frequency    Min 3X/week      PT Plan Current plan remains appropriate    Co-evaluation             End of Session Equipment Utilized During Treatment: Gait belt Activity Tolerance: Patient tolerated treatment well Patient left: with call bell/phone within reach;in bed;with bed alarm set;with family/visitor  present   PT Visit Diagnosis: Muscle weakness (generalized) (M62.81);Difficulty in walking, not elsewhere classified (R26.2)     Time: 6712-4580 PT Time Calculation (min) (ACUTE ONLY): 25 min  Charges:  $Therapeutic Activity: 23-37 mins                    G Codes:       Rica Koyanagi  PTA [facility]  Acute  Rehab Pager      [phone]

## 2016-11-22 ENCOUNTER — Encounter (HOSPITAL_COMMUNITY): Payer: Medicare Other

## 2016-11-22 LAB — BASIC METABOLIC PANEL
ANION GAP: 5 (ref 5–15)
BUN: 7 mg/dL (ref 6–20)
CO2: 32 mmol/L (ref 22–32)
Calcium: 9.3 mg/dL (ref 8.9–10.3)
Chloride: 100 mmol/L — ABNORMAL LOW (ref 101–111)
Creatinine, Ser: 0.81 mg/dL (ref 0.44–1.00)
GFR calc Af Amer: >60 mL/min (ref >60)
GFR, EST NON AFRICAN AMERICAN: 56 mL/min — AB (ref >60)
Glucose, Bld: 115 mg/dL — ABNORMAL HIGH (ref 65–99)
POTASSIUM: 3.8 mmol/L (ref 3.5–5.1)
SODIUM: 137 mmol/L (ref 135–145)

## 2016-11-22 MED ORDER — FUROSEMIDE 10 MG/ML IJ SOLN
40.0000 mg | Freq: Every day | INTRAMUSCULAR | Status: DC
  Administered 2016-11-22 – 2016-11-24 (×3): 40 mg via INTRAVENOUS
  Filled 2016-11-22 (×4): qty 4

## 2016-11-22 NOTE — Progress Notes (Signed)
Patient with no complaints or concerns during 7pm - 7am shift. Slept mostly during the night. Tylenol given for generalized pain and effective. Will continue to monitor.   Phuc Kluttz, RN

## 2016-11-22 NOTE — Progress Notes (Signed)
PROGRESS NOTE    Savannah Parks  WPY:[id number] DOB: 12-22-1910 DOA: 11/19/2016 PCP: Hayden Rasmussen., MD    Brief Narrative:  83 y.o. female with medical history significant of HTN, diastolic CHF last EF 05-39% with grade 2 dFx on 09/2016, HOH, legally blind, PE on Eliquis; resents with complaints of generalized weakness. History is obtained with the assistance of family who are present at bedside. The patient legs apparently gave way today and she was guided down to the floor. She was noted to have hit her forehead, but was not noted to have loss consciousness. Last few days family notes that the patient has had decreased overall appetite, complained of generalized pain all over, worsening lower extremity swelling, and they report her cough has somewhat improved. Just 2 days ago the patient was evaluated for chest pain complaints with CT angiogram of the chest which revealed possibilities of a pneumonia. Patient was continued on doxycycline which the family notes she's been on for total of approximately 6 days now for presumed pneumonia.  Assessment & Plan:   Principal Problem:   Failure to thrive in adult Active Problems:   Diastolic CHF, acute on chronic (HCC)   Cellulitis of leg, right   Anemia of chronic disease   GERD (gastroesophageal reflux disease)   Failure to thrive: Family notes recent decline and patient overall status with decreased appetite and generalized weakness. Question of possibility of underlying infection versus possibility of medication side effect. - Prealbumin noted to be low at 9.0 - Physical therapy was consulted, recommendations are for home health PT  Possibility of cellulitis of the right lower extremity: Acute. - Area around right lower extremity with outlining - Area of erythema has improved nicely since admission - Patient is continued on clindamycin IV  Bilateral pleural effusions and lower extremity edema: Acutely worsened on admission. Initial BNP  102.1. Suspecting diastolic congestive heart failure exacerbation vs third spacing secondary to malnutrition. Last echocardiogram done on 09/2015 with EF 70% with grade 2dFx. - Will continue strict I&Os - Wt on admit: 72.6kg - Wt on 09/28/16: 72.8kg - Wt 3/16: 72.2kg - Patient was given one dose of IV lasix on admission - Patient remains o2 dependent, weaning O2. Recent CXR was reviewed. B effusions are evident. Patient is continued on IV lasix. IV lasix chosen since there are signs of volume overload, thus it is expected that bowels are also edematous, thus absorption of PO lasix would be decreased, making PO lasix not effective  Recent diagnosis of pneumonia: Patient had a CT angiogram on approximate 2 days prior to admission with evidence of left sided pneumonia. Patient had been taking doxycycline for the last 6 days prior to admission. - Patient currently on antibiotics of clindamycin as seen above. There is lower suspicion for atypical pna given recent use of doxycycline.  History of pulmonary embolus - Patient is continued on Eliquis as tolerated - Remains stable at present  Anemia of chronic disease  - continue to monitor for now - remains stable  GERD - Patient to continue Carafate - Currently stable  DVT prophylaxis: Eliquis Code Status: DNR Family Communication: Pt in room, family at bedside Disposition Plan: Uncertain at this time  Consultants:     Procedures:     Antimicrobials: Anti-infectives    Start     Dose/Rate Route Frequency Ordered Stop   11/20/16 0015  clindamycin (CLEOCIN) IVPB 600 mg     600 mg 100 mL/hr over 30 Minutes Intravenous Every 8 hours 11/20/16 0001  11/20/16 0000  clindamycin (CLEOCIN) IVPB 600 mg  Status:  Discontinued     600 mg 100 mL/hr over 30 Minutes Intravenous Every 8 hours 11/19/16 2356 11/19/16 2358      Subjective: Patient denies chest pain. Reports feeling better  Objective: Vitals:   11/21/16 1245 11/21/16 1601  11/21/16 2013 11/22/16 0447  BP: 130/61  (!) 149/62 (!) 161/73  Pulse: 78  85 89  Resp: 20 19 18 18   Temp: 97.8 F (36.6 C)  97.7 F (36.5 C) 97.5 F (36.4 C)  TempSrc: Oral  Oral Oral  SpO2: 97% 98% 96% 96%  Weight:    69.4 kg (153 lb 1.6 oz)  Height:        Intake/Output Summary (Last 24 hours) at 11/22/16 1418 Last data filed at 11/22/16 0900  Gross per 24 hour  Intake              390 ml  Output                0 ml  Net              390 ml   Filed Weights   11/20/16 0130 11/21/16 0712 11/22/16 0447  Weight: 71.9 kg (158 lb 9.6 oz) 72.2 kg (159 lb 3.2 oz) 69.4 kg (153 lb 1.6 oz)    Examination:  General exam: Awake, conversant, laying in bed Respiratory system: normal chest rise, distant breath sounds, no wheezing Cardiovascular system: regular rhythm, s1-s2 on auscultation Gastrointestinal system: pos BS, soft, nondistended Central nervous system: no tremors, no seizure Extremities: no cyanosis, no joint deformities Skin: no rashes, area of erythema over BLE much improved Psychiatry: affect normal// no auditory hallucinations   Data Reviewed: I have personally reviewed following labs and imaging studies  CBC:  Recent Labs Lab 11/17/16 1916 11/19/16 2004 11/20/16 0514  WBC 11.8* 6.8 7.8  NEUTROABS  --  4.1 4.5  HGB 11.3* 11.5* 11.4*  HCT 37.9 38.5 37.9  MCV 79.0 79.1 78.1  PLT 321 324 161   Basic Metabolic Panel:  Recent Labs Lab 11/17/16 1916 11/19/16 2004 11/20/16 0514 11/21/16 0537 11/22/16 0500  NA 131* 133* 135 135 137  K 3.8 4.3 3.6 3.7 3.8  CL 94* 101 102 102 100*  CO2 27 25 26 29  32  GLUCOSE 140* 104* 97 92 115*  BUN 11 9 8 7 7   CREATININE 0.84 0.79 0.74 0.81 0.81  CALCIUM 9.4 9.5 9.2 9.1 9.3   GFR: Estimated Creatinine Clearance: 32.7 mL/min (by C-G formula based on SCr of 0.81 mg/dL). Liver Function Tests:  Recent Labs Lab 11/19/16 2004  AST 21  ALT 10*  ALKPHOS 76  BILITOT 0.4  PROT 6.0*  ALBUMIN 2.5*   No results for  input(s): LIPASE, AMYLASE in the last 168 hours. No results for input(s): AMMONIA in the last 168 hours. Coagulation Profile:  Recent Labs Lab 11/17/16 1916  INR 1.44   Cardiac Enzymes:  Recent Labs Lab 11/19/16 2004 11/20/16 0216 11/20/16 0514 11/20/16 1127  TROPONINI <0.03 <0.03 <0.03 <0.03   BNP (last 3 results) No results for input(s): PROBNP in the last 8760 hours. HbA1C: No results for input(s): HGBA1C in the last 72 hours. CBG:  Recent Labs Lab 11/19/16 2005  GLUCAP 102*   Lipid Profile: No results for input(s): CHOL, HDL, LDLCALC, TRIG, CHOLHDL, LDLDIRECT in the last 72 hours. Thyroid Function Tests: No results for input(s): TSH, T4TOTAL, FREET4, T3FREE, THYROIDAB in the last 72 hours. Anemia  Panel: No results for input(s): VITAMINB12, FOLATE, FERRITIN, TIBC, IRON, RETICCTPCT in the last 72 hours. Sepsis Labs: No results for input(s): PROCALCITON, LATICACIDVEN in the last 168 hours.  No results found for this or any previous visit (from the past 240 hour(s)).   Radiology Studies: No results found.  Scheduled Meds: . acidophilus  2 capsule Oral TID  . apixaban  2.5 mg Oral BID  . calcium carbonate  500 mg of elemental calcium Oral Q breakfast  . clindamycin (CLEOCIN) IV  600 mg Intravenous Q8H  . furosemide  40 mg Intravenous Daily  . senna  1 tablet Oral QHS  . sodium chloride flush  3 mL Intravenous Q12H  . sucralfate  1 g Oral BID   Continuous Infusions:   LOS: 1 day   Desi Carby, Orpah Melter, MD [facility] Pager [phone]  If 7PM-7AM, please contact night-coverage [email or web address] Password [email or web address] 11/22/2016, 2:18 PM

## 2016-11-23 ENCOUNTER — Inpatient Hospital Stay (HOSPITAL_COMMUNITY): Payer: Medicare Other

## 2016-11-23 DIAGNOSIS — L0390 Cellulitis, unspecified: Secondary | ICD-10-CM

## 2016-11-23 LAB — BASIC METABOLIC PANEL
ANION GAP: 7 (ref 5–15)
BUN: 7 mg/dL (ref 6–20)
CO2: 33 mmol/L — ABNORMAL HIGH (ref 22–32)
CREATININE: 0.89 mg/dL (ref 0.44–1.00)
Calcium: 9.1 mg/dL (ref 8.9–10.3)
Chloride: 98 mmol/L — ABNORMAL LOW (ref 101–111)
GFR calc Af Amer: 58 mL/min — ABNORMAL LOW (ref >60)
GFR, EST NON AFRICAN AMERICAN: 50 mL/min — AB (ref >60)
GLUCOSE: 98 mg/dL (ref 65–99)
Potassium: 4.7 mmol/L (ref 3.5–5.1)
Sodium: 138 mmol/L (ref 135–145)

## 2016-11-23 MED ORDER — MAGNESIUM HYDROXIDE 400 MG/5ML PO SUSP
15.0000 mL | Freq: Every day | ORAL | Status: DC | PRN
  Administered 2016-11-23: 15 mL via ORAL
  Filled 2016-11-23: qty 30

## 2016-11-23 MED ORDER — CLINDAMYCIN HCL 150 MG PO CAPS
150.0000 mg | ORAL_CAPSULE | Freq: Four times a day (QID) | ORAL | Status: DC
  Administered 2016-11-23 – 2016-11-24 (×4): 150 mg via ORAL
  Filled 2016-11-23 (×5): qty 1

## 2016-11-23 NOTE — Progress Notes (Signed)
Patient slept mostly during the night. Complained of generalized and arm pain. PRN given and effective. Will continue to monitor.  Thomasa Heidler, RN

## 2016-11-23 NOTE — Progress Notes (Signed)
PROGRESS NOTE    Savannah Parks  OEU:[id number] DOB: 28-Nov-1910 DOA: 11/19/2016 PCP: Hayden Rasmussen., MD    Brief Narrative:  83 y.o. female with medical history significant of HTN, diastolic CHF last EF 15-40% with grade 2 dFx on 09/2016, HOH, legally blind, PE on Eliquis; resents with complaints of generalized weakness. History is obtained with the assistance of family who are present at bedside. The patient legs apparently gave way today and she was guided down to the floor. She was noted to have hit her forehead, but was not noted to have loss consciousness. Last few days family notes that the patient has had decreased overall appetite, complained of generalized pain all over, worsening lower extremity swelling, and they report her cough has somewhat improved. Just 2 days ago the patient was evaluated for chest pain complaints with CT angiogram of the chest which revealed possibilities of a pneumonia. Patient was continued on doxycycline which the family notes she's been on for total of approximately 6 days now for presumed pneumonia.  Assessment & Plan:   Principal Problem:   Failure to thrive in adult Active Problems:   Diastolic CHF, acute on chronic (HCC)   Cellulitis of leg, right   Anemia of chronic disease   GERD (gastroesophageal reflux disease)   Failure to thrive: Family notes recent decline and patient overall status with decreased appetite and generalized weakness. Question of possibility of underlying infection versus possibility of medication side effect. - Prealbumin noted to be low at 9.0 - Physical therapy was consulted, thus far, recommendations are for home health PT  Possibility of cellulitis of the right lower extremity: Acute. - Area around right lower extremity with outlining - Area of erythema has improved nicely since admission - Patient is continued on clindamycin IV. Will transition to PO abx  Bilateral pleural effusions and lower extremity edema:  Acutely worsened on admission. Initial BNP 102.1. Suspecting diastolic congestive heart failure exacerbation vs third spacing secondary to malnutrition. Last echocardiogram done on 09/2015 with EF 70% with grade 2dFx. - Will continue strict I&Os - Wt on admit: 72.6kg - Wt on 09/28/16: 72.8kg - Wt today: 68.8kg - Patient remains o2 dependent, weaning O2. Recent CXR was reviewed. B effusions are evident. Patient is continued on IV lasix. IV lasix chosen since there are signs of volume overload, thus it is expected that bowels are also edematous, thus absorption of PO lasix would be decreased, making PO lasix not effective - Continue to diurese as tolerated - Le dopplers neg for DVT  Recent diagnosis of pneumonia: Patient had a CT angiogram on approximate 2 days prior to admission with evidence of left sided pneumonia. Patient had been taking doxycycline for the last 6 days prior to admission. - Patient currently on antibiotics of clindamycin as seen above. There is lower suspicion for atypical pna given recent use of doxycycline.  History of pulmonary embolus - Patient is continued on Eliquis as tolerated - Remains stable at present  Anemia of chronic disease  - continue to monitor for now - remains stable  GERD - Patient to continue Carafate - Currently stable  DVT prophylaxis: Eliquis Code Status: DNR Family Communication: Pt in room, family at bedside Disposition Plan: Uncertain at this time  Consultants:     Procedures:     Antimicrobials: Anti-infectives    Start     Dose/Rate Route Frequency Ordered Stop   11/20/16 0015  clindamycin (CLEOCIN) IVPB 600 mg     600 mg 100 mL/hr over  30 Minutes Intravenous Every 8 hours 11/20/16 0001     11/20/16 0000  clindamycin (CLEOCIN) IVPB 600 mg  Status:  Discontinued     600 mg 100 mL/hr over 30 Minutes Intravenous Every 8 hours 11/19/16 2356 11/19/16 2358      Subjective: Without complaints  Objective: Vitals:    11/22/16 2150 11/23/16 0545 11/23/16 0931 11/23/16 1207  BP: (!) 152/67 140/71 (!) 177/71 (!) 136/57  Pulse: 81 74 86 85  Resp: 20 18  20   Temp: 98 F (36.7 C) 97.8 F (36.6 C)  97.5 F (36.4 C)  TempSrc: Oral Oral  Oral  SpO2: 97% 96% 93% 90%  Weight:  68.8 kg (151 lb 11.2 oz)    Height:        Intake/Output Summary (Last 24 hours) at 11/23/16 1526 Last data filed at 11/23/16 4098  Gross per 24 hour  Intake              510 ml  Output                0 ml  Net              510 ml   Filed Weights   11/21/16 0712 11/22/16 0447 11/23/16 0545  Weight: 72.2 kg (159 lb 3.2 oz) 69.4 kg (153 lb 1.6 oz) 68.8 kg (151 lb 11.2 oz)    Examination:  General exam: Laying in bed, awake, in nad Respiratory system: normal chest rise, distant breath sounds, no wheezing Cardiovascular system: regular rhythm, s1-s2 on auscultation Gastrointestinal system: pos BS, soft, nondistended Central nervous system: no tremors, no seizure Extremities: no cyanosis, no joint deformities Skin: no rashes, area of erythema over BLE much improved Psychiatry: affect normal// no auditory hallucinations   Data Reviewed: I have personally reviewed following labs and imaging studies  CBC:  Recent Labs Lab 11/17/16 1916 11/19/16 2004 11/20/16 0514  WBC 11.8* 6.8 7.8  NEUTROABS  --  4.1 4.5  HGB 11.3* 11.5* 11.4*  HCT 37.9 38.5 37.9  MCV 79.0 79.1 78.1  PLT 321 324 119   Basic Metabolic Panel:  Recent Labs Lab 11/19/16 2004 11/20/16 0514 11/21/16 0537 11/22/16 0500 11/23/16 0511  NA 133* 135 135 137 138  K 4.3 3.6 3.7 3.8 4.7  CL 101 102 102 100* 98*  CO2 25 26 29  32 33*  GLUCOSE 104* 97 92 115* 98  BUN 9 8 7 7 7   CREATININE 0.79 0.74 0.81 0.81 0.89  CALCIUM 9.5 9.2 9.1 9.3 9.1   GFR: Estimated Creatinine Clearance: 29.6 mL/min (by C-G formula based on SCr of 0.89 mg/dL). Liver Function Tests:  Recent Labs Lab 11/19/16 2004  AST 21  ALT 10*  ALKPHOS 76  BILITOT 0.4  PROT 6.0*    ALBUMIN 2.5*   No results for input(s): LIPASE, AMYLASE in the last 168 hours. No results for input(s): AMMONIA in the last 168 hours. Coagulation Profile:  Recent Labs Lab 11/17/16 1916  INR 1.44   Cardiac Enzymes:  Recent Labs Lab 11/19/16 2004 11/20/16 0216 11/20/16 0514 11/20/16 1127  TROPONINI <0.03 <0.03 <0.03 <0.03   BNP (last 3 results) No results for input(s): PROBNP in the last 8760 hours. HbA1C: No results for input(s): HGBA1C in the last 72 hours. CBG:  Recent Labs Lab 11/19/16 2005  GLUCAP 102*   Lipid Profile: No results for input(s): CHOL, HDL, LDLCALC, TRIG, CHOLHDL, LDLDIRECT in the last 72 hours. Thyroid Function Tests: No results for input(s): TSH,  T4TOTAL, FREET4, T3FREE, THYROIDAB in the last 72 hours. Anemia Panel: No results for input(s): VITAMINB12, FOLATE, FERRITIN, TIBC, IRON, RETICCTPCT in the last 72 hours. Sepsis Labs: No results for input(s): PROCALCITON, LATICACIDVEN in the last 168 hours.  No results found for this or any previous visit (from the past 240 hour(s)).   Radiology Studies: No results found.  Scheduled Meds: . acidophilus  2 capsule Oral TID  . apixaban  2.5 mg Oral BID  . calcium carbonate  500 mg of elemental calcium Oral Q breakfast  . clindamycin (CLEOCIN) IV  600 mg Intravenous Q8H  . furosemide  40 mg Intravenous Daily  . senna  1 tablet Oral QHS  . sodium chloride flush  3 mL Intravenous Q12H  . sucralfate  1 g Oral BID   Continuous Infusions:   LOS: 2 days   CHIU, Orpah Melter, MD [facility] Pager [phone]  If 7PM-7AM, please contact night-coverage [email or web address] Password [facility] 11/23/2016, 3:26 PM

## 2016-11-23 NOTE — Plan of Care (Signed)
Problem: Education: Goal: Knowledge of [facility] Education information/materials will improve Outcome: Completed/Met Date Met: 11/23/16 Discussed with family although patient is alert and oriented x 3  Problem: Activity: Goal: Risk for activity intolerance will decrease Outcome: Not Progressing Patient quite weak, not at level she was which was independent with walker   Problem: Nutrition: Goal: Adequate nutrition will be maintained Outcome: Not Progressing Poor po intake

## 2016-11-23 NOTE — Progress Notes (Signed)
*  Preliminary Results* Bilateral lower extremity venous duplex completed. Bilateral lower extremities are negative for deep vein thrombosis. There is no evidence of Baker's cyst bilaterally.  11/23/2016 8:39 AM Maudry Mayhew, BS, RVT, RDCS, RDMS

## 2016-11-23 NOTE — Progress Notes (Signed)
RN and NT assisted patient up to chair, max assist to stand, difficult for patient to stand up straight.  Daughter in room, RN notified her that it did take 2 people to get patient up and standing and to the chair.  Daughter says she understands patient will likely need rehab prior to going home.

## 2016-11-23 NOTE — Progress Notes (Signed)
Patient stable during this shift, O2 sats remain 90 and above on room air.  Patient continues to diurese with multiple large incontinent episodes.  No BM x 4 days, milk of magnesia given without results on this shift.  Patient tolerated sitting up in chair.  PO intake is poor for food, will drink water and Ensure in adequate amounts but usually eats only a few bites at meals.  Various family members in to visit during shift.

## 2016-11-24 LAB — BASIC METABOLIC PANEL
ANION GAP: 9 (ref 5–15)
BUN: 8 mg/dL (ref 6–20)
CO2: 34 mmol/L — ABNORMAL HIGH (ref 22–32)
Calcium: 9.6 mg/dL (ref 8.9–10.3)
Chloride: 93 mmol/L — ABNORMAL LOW (ref 101–111)
Creatinine, Ser: 0.87 mg/dL (ref 0.44–1.00)
GFR calc non Af Amer: 52 mL/min — ABNORMAL LOW (ref >60)
GFR, EST AFRICAN AMERICAN: 60 mL/min — AB (ref >60)
Glucose, Bld: 93 mg/dL (ref 65–99)
POTASSIUM: 3.9 mmol/L (ref 3.5–5.1)
SODIUM: 136 mmol/L (ref 135–145)

## 2016-11-24 MED ORDER — HYDROCODONE-ACETAMINOPHEN 5-325 MG PO TABS
0.5000 | ORAL_TABLET | ORAL | 0 refills | Status: DC | PRN
Start: 2016-11-24 — End: 2017-04-27

## 2016-11-24 MED ORDER — RISAQUAD PO CAPS: 2.0000 | ORAL_CAPSULE | Freq: Three times a day (TID) | ORAL | 0 refills | Status: DC

## 2016-11-24 MED ORDER — CLINDAMYCIN HCL 150 MG PO CAPS: 150.0000 mg | ORAL_CAPSULE | Freq: Four times a day (QID) | ORAL | 0 refills | Status: DC

## 2016-11-24 MED ORDER — FUROSEMIDE 40 MG PO TABS: 40.0000 mg | ORAL_TABLET | Freq: Every day | ORAL | 0 refills | Status: DC

## 2016-11-24 NOTE — Progress Notes (Signed)
LCSW has followed up with facility as well as family. Agreeable for SNF and bed at [facility].   Family is aware of co-pay:  160.00/per day and also wanting her transported by EMS.  Disposition change:  Was to go home with home health, however family is wanting SNF currently. SNF work up completed. Bed at [facility] once medically stable.  Lane Hacker, MSW Clinical Social Work: [employer] Coverage for :  [phone]

## 2016-11-24 NOTE — Progress Notes (Signed)
Pt family at bedside concerned to take pt home with them. Case management called and at bedside discussing discharge with pt and pt family now  [employer]

## 2016-11-24 NOTE — Discharge Instructions (Signed)
Please hold lasix if you are eating less than 50% of your meals

## 2016-11-24 NOTE — Progress Notes (Signed)
Physical Therapy Treatment Patient Details Name: Savannah Parks MRN: [id number] DOB: 02-Dec-1910 Today's Date: 11/24/2016    History of Present Illness Pt adm with weakness and FTT. Pt with possible PNA. Pt with fall prior to admission. PMH - rt humeral fx 12/17, PE 09/2016, HTN, bladder CA, HOH, legally blind.    PT Comments    Patient seen for mobility progression. Assisted patient with bed mobility, hygiene and pericare prior to OOB to chair. Patient tolerated well. Continues to require increased moderate assist for OOb activity. Will continue to see and progress as tolerated.   Follow Up Recommendations  Home health PT;Supervision/Assistance - 24 hour     Equipment Recommendations  None recommended by PT    Recommendations for Other Services       Precautions / Restrictions Precautions Precautions: Fall Precaution Comments: HOH, legally blind but can see shapes/light/dark Restrictions Weight Bearing Restrictions: No    Mobility  Bed Mobility Overal bed mobility: Needs Assistance Bed Mobility: Rolling;Supine to Sit Rolling: Min assist   Supine to sit: Mod assist     General bed mobility comments: min assist to roll bilaterally, increased moderate assist to elevate trunk and bring hips to EOb  Transfers Overall transfer level: Needs assistance Equipment used: None Transfers: Stand Pivot Transfers Sit to Stand: Mod assist Stand pivot transfers: Mod assist       General transfer comment: moderate assist to pivot OOB to chair  Ambulation/Gait             General Gait Details: did not attempt due to no second person to assist   Stairs            Wheelchair Mobility    Modified Rankin (Stroke Patients Only)       Balance Overall balance assessment: Needs assistance Sitting-balance support: Bilateral upper extremity supported;Feet supported Sitting balance-Leahy Scale: Poor Sitting balance - Comments: UE support  Postural control: Right  lateral lean Standing balance support: Bilateral upper extremity supported Standing balance-Leahy Scale: Poor Standing balance comment: increased support for brief static standing prior to transfer                    Cognition Arousal/Alertness: Awake/alert Behavior During Therapy: WFL for tasks assessed/performed Overall Cognitive Status: No family/caregiver present to determine baseline cognitive functioning                      Exercises      General Comments        Pertinent Vitals/Pain Pain Assessment: Faces Faces Pain Scale: Hurts little more Pain Location: "all over" Pain Descriptors / Indicators: Grimacing;Sore Pain Intervention(s): Monitored during session    Home Living                      Prior Function            PT Goals (current goals can now be found in the care plan section) Acute Rehab PT Goals Patient Stated Goal: return home PT Goal Formulation: With patient Time For Goal Achievement: 11/27/16 Potential to Achieve Goals: Good Progress towards PT goals: Progressing toward goals    Frequency    Min 3X/week      PT Plan Current plan remains appropriate    Co-evaluation             End of Session Equipment Utilized During Treatment: Gait belt Activity Tolerance: Patient tolerated treatment well Patient left: in chair;with call bell/phone within reach;with chair alarm  set Nurse Communication: Mobility status PT Visit Diagnosis: Muscle weakness (generalized) (M62.81);Difficulty in walking, not elsewhere classified (R26.2)     Time: 4383-8184 PT Time Calculation (min) (ACUTE ONLY): 18 min  Charges:  $Therapeutic Activity: 8-22 mins                    G Codes:       Duncan Dull 27-Nov-2016, 10:41 AM Alben Deeds, PT DPT  [phone]

## 2016-11-24 NOTE — Progress Notes (Signed)
Talked to case management, she stated pt will be discharged with family.   Savannah Parks

## 2016-11-24 NOTE — Progress Notes (Signed)
Pt discharged via PTAR. PTAR at bedside now. Pt IV discontinued, catheter intact. Telemetry removed. Transporter has DNR form, AVS, printed prescriptions and all pt belongings.  Savannah Parks

## 2016-11-24 NOTE — Discharge Summary (Signed)
Physician Discharge Summary  Savannah Parks KGM:[id number] DOB: [birth year] DOA: 11/19/2016  PCP: Hayden Rasmussen., MD  Admit date: 11/19/2016 Discharge date: 11/24/2016  Admitted From: Home Disposition:  SNF  Recommendations for Outpatient Follow-up:  1. Follow up with PCP in 1-2 weeks 2. Please obtain BMP in one week 3. Please hold lasix if patient eats <50% meals 4. Complete clindamycin for 3 more days of treatment  Wound care instructions for LE wounds: Cleanse with NS, gently pat dry, apply Xeroform gauze, wrap with kerlix, change daily  Home Health:HH/PT/RN/Aide   Discharge Condition:Improved CODE STATUS:DNR Diet recommendation: Heart healthy   Brief/Interim Summary: 83 y.o.femalewith medical history significant ofHTN, diastolic CHF last EF 64-40% with grade 2 dFx on 09/2016, HOH, legally blind, PE on Eliquis; resents with complaints of generalized weakness. History is obtained with the assistance of family who are present at bedside. The patient legs apparently gave way today and she was guided down to the floor. She was noted to have hit her forehead,but was not noted to have loss consciousness. Last few days family notes that the patient has had decreased overall appetite, complained of generalized pain all over, worsening lower extremity swelling, and they report her cough has somewhat improved. Just 2 days ago the patient was evaluated for chest pain complaints with CT angiogram of the chest which revealed possibilities of a pneumonia. Patient was continued on doxycycline which the family notes she's been on for total of approximately 6 days now for presumed pneumonia.   Failure to thrive: Family notes recent decline and patient overall status with decreased appetite and generalized weakness. Question of possibility of underlying infection versus possibility of medication side effect. - Prealbumin noted to be low at 9.0 - Physical therapy was consulted, thus far. Patient is for  SNF placement  Possibility of cellulitis of the right lower extremity: Acute. - Area around right lower extremity with outlining - Area of erythema has improved nicely since admission - Patient was continued on clindamycin IV. Will complete course of PO abx x 3 more days treatment  Bilateral pleural effusions andlower extremity edema: Acutely worsened on admission. Initial BNP 102.1. Suspecting diastolic congestive heart failure exacerbation vs third spacing secondary to malnutrition. Last echocardiogram done on 09/2015 with EF 70% with grade 2dFx. - Will continue strict I&Os - Wt on admit: 72.6kg - Wt on 09/28/16: 72.8kg - Wt on discharge: 68.9kg - Patient remains o2 dependent, weaning O2. Recent CXR was reviewed. B effusions are evident. Patient was continued on IV lasix. - Patient to continue on PO lasix at discharge. Please hold lasix if patient eats <50% of meals - Le dopplers neg for DVT  Recent diagnosis of pneumonia: Patient had a CT angiogram on approximate 2 days prior to [place] evidence of left sided pneumonia. Patient had been taking doxycycline for the last 6 days prior to admission. - Patient currently on antibiotics of clindamycin as seenabove. There is lowersuspicion for atypical pna given recent use ofdoxycycline.  History of pulmonary embolus - Patient is continued on Eliquis as tolerated - Remains stable at present  Anemia of chronic disease  - continue to monitor for now - remains stable  GERD - Patient to continue Carafate - Currently stable  Discharge Diagnoses:  Principal Problem:   Failure to thrive in adult Active Problems:   Diastolic CHF, acute on chronic (HCC)   Cellulitis of leg, right   Anemia of chronic disease   GERD (gastroesophageal reflux disease)    Discharge Instructions  Allergies as of 11/24/2016      Reactions   Tape Other (See Comments)   SKIN IS VERY THIN; TAPE WILL TEAR AND BRUISE THE PATIENT'S SKIN!! MUST USE  PAPER TAPE   Keflet [cephalexin] Rash   Sulfa Antibiotics Rash      Medication List    STOP taking these medications   benzonatate 100 MG capsule Commonly known as:  TESSALON   doxycycline 100 MG capsule Commonly known as:  VIBRAMYCIN     TAKE these medications   acetaminophen 325 MG tablet Commonly known as:  TYLENOL Take 2 tablets (650 mg total) by mouth every 6 (six) hours as needed for mild pain (or Fever >/= 101).   acidophilus Caps capsule Take 2 capsules by mouth 3 (three) times daily.   alendronate 70 MG tablet Commonly known as:  FOSAMAX Take 70 mg by mouth every Tuesday. Take usually on Mondays or Tuesdays   apixaban 5 MG Tabs tablet Commonly known as:  ELIQUIS Take 1 tablet (5 mg total) by mouth 2 (two) times daily. What changed:  how much to take   CALCIUM 600 PO Take 1 tablet by mouth daily.   clindamycin 150 MG capsule Commonly known as:  CLEOCIN Take 1 capsule (150 mg total) by mouth every 6 (six) hours.   docusate sodium 100 MG capsule Commonly known as:  COLACE Take 100 mg by mouth daily as needed for mild constipation.   furosemide 40 MG tablet Commonly known as:  LASIX Take 1 tablet (40 mg total) by mouth daily. What changed:  medication strength  how much to take  when to take this  reasons to take this   HYDROcodone-acetaminophen 5-325 MG tablet Commonly known as:  NORCO/VICODIN Take 0.5 tablets by mouth at bedtime as needed for pain. What changed:  Another medication with the same name was added. Make sure you understand how and when to take each.   HYDROcodone-acetaminophen 5-325 MG tablet Commonly known as:  NORCO/VICODIN Take 0.5 tablets by mouth every 4 (four) hours as needed for moderate pain. What changed:  You were already taking a medication with the same name, and this prescription was added. Make sure you understand how and when to take each.   naphazoline-pheniramine 0.025-0.3 % ophthalmic solution Commonly known as:   NAPHCON-A Place 1 drop into both eyes 4 (four) times daily as needed for irritation or allergies.   senna 8.6 MG Tabs tablet Commonly known as:  SENOKOT Take 1 tablet (8.6 mg total) by mouth at bedtime.   sucralfate 1 g tablet Commonly known as:  CARAFATE Take 1 g by mouth 2 (two) times daily as needed (stomach).   triamcinolone cream 0.1 % Commonly known as:  KENALOG Apply 1 application topically daily as needed (rash). TO LEGS   Vitamin D-3 1000 units Caps Take 1 capsule by mouth daily.      Follow-up Information    Hayden Rasmussen., MD. Daphane Shepherd on 11/28/2016.   Specialty:  Family Medicine Why:  @2 :00pm Contact information: [place] [address] [phone]          Allergies  Allergen Reactions  . Tape Other (See Comments)    SKIN IS VERY THIN; TAPE WILL TEAR AND BRUISE THE PATIENT'S SKIN!! MUST USE PAPER TAPE  . Keflet [Cephalexin] Rash  . Sulfa Antibiotics Rash    Procedures/Studies: Dg Chest 2 View  Result Date: 11/17/2016 CLINICAL DATA:  Hervey Ard, substernal chest pain for 3 weeks EXAM: CHEST  2 VIEW  COMPARISON:  Chest radiograph 09/27/2016 FINDINGS: Cardiomediastinal contours are unchanged with atherosclerotic calcification in the aortic arch. Small bilateral pleural effusions and associated basilar atelectasis are unchanged. No pneumothorax. No pulmonary edema. IMPRESSION: Unchanged small bilateral pleural effusions and associated atelectasis. Calcific aortic atherosclerosis. Electronically Signed   By: Ulyses Jarred M.D.   On: 11/17/2016 19:57   Ct Head Wo Contrast  Result Date: 11/19/2016 CLINICAL DATA:  Fall.  History of hypertension and cancer. EXAM: CT HEAD WITHOUT CONTRAST TECHNIQUE: Contiguous axial images were obtained from the base of the skull through the vertex without intravenous contrast. COMPARISON:  08/15/2016 FINDINGS: Brain: Diffuse cerebral atrophy. Ventricular dilatation consistent with central atrophy. Low-attenuation changes  throughout the deep white matter consistent with small vessel ischemia. No mass effect or midline shift. No abnormal extra-axial fluid collections. Gray-white matter junctions are distinct. Basal cisterns are not effaced. No acute intracranial hemorrhage. Vascular: Vascular calcifications are present in the carotid siphons and vertebrobasilar vessels. Skull: Calvarium appears intact. Sinuses/Orbits: Visualized paranasal sinuses and mastoid air cells are not opacified. Other: Small subcutaneous scalp hematoma over the left anterior frontal region. IMPRESSION: No acute intracranial abnormalities. Chronic atrophy and small vessel ischemic changes. Electronically Signed   By: Lucienne Capers M.D.   On: 11/19/2016 22:09   Ct Angio Chest Pe W And/or Wo Contrast  Result Date: 11/17/2016 CLINICAL DATA:  83 year old female with history of pulmonary embolism. Evaluate for increased clot burden. EXAM: CT ANGIOGRAPHY CHEST WITH CONTRAST TECHNIQUE: Multidetector CT imaging of the chest was performed using the standard protocol during bolus administration of intravenous contrast. Multiplanar CT image reconstructions and MIPs were obtained to evaluate the vascular anatomy. CONTRAST:  80 mL of Isovue 370. COMPARISON:  Chest CT 09/27/2016. FINDINGS: Cardiovascular: No filling defects within the pulmonary arterial tree to suggest underlying pulmonary embolism. Heart size is normal. Trace amount of pericardial fluid and/or thickening, unlikely to be of any hemodynamic significance at this time. No associated pericardial calcification. There is aortic atherosclerosis, as well as atherosclerosis of the great vessels of the mediastinum and the coronary arteries, including calcified atherosclerotic plaque in the left main, left anterior descending and right coronary arteries. Calcifications of the aortic valve and mitral annulus (mild). Mediastinum/Nodes: No pathologically enlarged mediastinal or hilar lymph nodes. Esophagus is  unremarkable in appearance. No axillary lymphadenopathy. Lungs/Pleura: Patchy multifocal ground-glass attenuation and mild interlobular septal thickening suggesting a background of mild interstitial pulmonary edema. Small focus of consolidation and volume loss in the left lower lobe, best appreciated on axial image 74 of series 7, concerning for infection. Moderate bilateral pleural effusions lying dependently. No definite suspicious appearing pulmonary nodules or masses are noted. Upper Abdomen: Aortic atherosclerosis. Musculoskeletal: Old compression fracture of superior endplate of T2 with approximately 20% loss of vertebral body height is unchanged. There are no aggressive appearing lytic or blastic lesions noted in the visualized portions of the skeleton. Review of the MIP images confirms the above findings. IMPRESSION: 1. No evidence of pulmonary embolism. 2. Small focus of airspace consolidation and volume loss in the left lower lobe, concerning for pneumonia. 3. Background of mild interstitial pulmonary edema and moderate bilateral pleural effusions. 4. Aortic atherosclerosis, in addition to left main and 3 vessel coronary artery disease. 5. There are calcifications of the aortic valve and mitral annulus. Echocardiographic correlation for evaluation of potential valvular dysfunction may be warranted if clinically indicated. Electronically Signed   By: Vinnie Langton M.D.   On: 11/17/2016 21:29   Dg Chest [facility] 1 [address]  Result Date: 11/19/2016 CLINICAL DATA:  83 year old female with shortness of breath. EXAM: PORTABLE CHEST 1 VIEW COMPARISON:  Chest CT dated 11/17/2016 FINDINGS: There bilateral pleural effusions with bibasilar compressive atelectasis versus infiltrate. Curvilinear density extending from the left hilum may represent atelectasis or scarring. There is no pneumothorax. The cardiac silhouette is within normal limits. The aorta is tortuous. There is atherosclerotic calcification of the  thoracic aorta. There is osteopenia with old healed fracture deformity of the right humeral neck. No acute fracture identified. IMPRESSION: 1. Bilateral pleural effusions with bibasilar atelectasis versus infiltrate. 2. Aortic atherosclerosis. Electronically Signed   By: Anner Crete M.D.   On: 11/19/2016 20:35    Subjective: Eager to go home  Discharge Exam: Vitals:   11/23/16 2040 11/24/16 0542  BP: 135/68 (!) 152/64  Pulse: 90 90  Resp: 20 18  Temp: 97.8 F (36.6 C) 97.8 F (36.6 C)   Vitals:   11/23/16 0931 11/23/16 1207 11/23/16 2040 11/24/16 0542  BP: (!) 177/71 (!) 136/57 135/68 (!) 152/64  Pulse: 86 85 90 90  Resp:  20 20 18   Temp:  97.5 F (36.4 C) 97.8 F (36.6 C) 97.8 F (36.6 C)  TempSrc:  Oral Oral Oral  SpO2: 93% 90% 92% 93%  Weight:    68.9 kg (152 lb)  Height:        General: Pt is alert, awake, not in acute distress Cardiovascular: RRR, S1/S2 +, no rubs, no gallops Respiratory: CTA bilaterally, no wheezing, no rhonchi Abdominal: Soft, NT, ND, bowel sounds + Extremities: no edema, no cyanosis   The results of significant diagnostics from this hospitalization (including imaging, microbiology, ancillary and laboratory) are listed below for reference.     Microbiology: No results found for this or any previous visit (from the past 240 hour(s)).   Labs: BNP (last 3 results)  Recent Labs  11/19/16 2004  BNP 734.1*   Basic Metabolic Panel:  Recent Labs Lab 11/20/16 0514 11/21/16 0537 11/22/16 0500 11/23/16 0511 11/24/16 0746  NA 135 135 137 138 136  K 3.6 3.7 3.8 4.7 3.9  CL 102 102 100* 98* 93*  CO2 26 29 32 33* 34*  GLUCOSE 97 92 115* 98 93  BUN 8 7 7 7 8   CREATININE 0.74 0.81 0.81 0.89 0.87  CALCIUM 9.2 9.1 9.3 9.1 9.6   Liver Function Tests:  Recent Labs Lab 11/19/16 2004  AST 21  ALT 10*  ALKPHOS 76  BILITOT 0.4  PROT 6.0*  ALBUMIN 2.5*   No results for input(s): LIPASE, AMYLASE in the last 168 hours. No results for  input(s): AMMONIA in the last 168 hours. CBC:  Recent Labs Lab 11/17/16 1916 11/19/16 2004 11/20/16 0514  WBC 11.8* 6.8 7.8  NEUTROABS  --  4.1 4.5  HGB 11.3* 11.5* 11.4*  HCT 37.9 38.5 37.9  MCV 79.0 79.1 78.1  PLT 321 324 356   Cardiac Enzymes:  Recent Labs Lab 11/19/16 2004 11/20/16 0216 11/20/16 0514 11/20/16 1127  TROPONINI <0.03 <0.03 <0.03 <0.03   BNP: Invalid input(s): POCBNP CBG:  Recent Labs Lab 11/19/16 2005  GLUCAP 102*   D-Dimer No results for input(s): DDIMER in the last 72 hours. Hgb A1c No results for input(s): HGBA1C in the last 72 hours. Lipid Profile No results for input(s): CHOL, HDL, LDLCALC, TRIG, CHOLHDL, LDLDIRECT in the last 72 hours. Thyroid function studies No results for input(s): TSH, T4TOTAL, T3FREE, THYROIDAB in the last 72 hours.  Invalid input(s): FREET3 Anemia work up  No results for input(s): VITAMINB12, FOLATE, FERRITIN, TIBC, IRON, RETICCTPCT in the last 72 hours. Urinalysis    Component Value Date/Time   COLORURINE YELLOW 11/19/2016 2053   APPEARANCEUR CLEAR 11/19/2016 2053   LABSPEC 1.015 11/19/2016 2053   PHURINE 6.0 11/19/2016 2053   GLUCOSEU NEGATIVE 11/19/2016 2053   HGBUR SMALL (A) 11/19/2016 2053   BILIRUBINUR NEGATIVE 11/19/2016 2053   KETONESUR NEGATIVE 11/19/2016 2053   PROTEINUR NEGATIVE 11/19/2016 2053   UROBILINOGEN 1.0 07/22/2015 1210   NITRITE NEGATIVE 11/19/2016 2053   LEUKOCYTESUR NEGATIVE 11/19/2016 2053   Sepsis Labs Invalid input(s): PROCALCITONIN,  WBC,  LACTICIDVEN Microbiology No results found for this or any previous visit (from the past 240 hour(s)).   SIGNED:   Donne Hazel, MD  [facility] 11/24/2016, 2:44 PM  If 7PM-7AM, please contact night-coverage [email or web address] Password [email or web address]

## 2016-11-24 NOTE — Clinical Social Work Placement (Addendum)
Going to room 103A [phone]  (report number)   CLINICAL SOCIAL WORK PLACEMENT  NOTE  Date:  11/24/2016  Patient Details  Name: Savannah Parks MRN: [id number] Date of Birth: 1911/03/23  Clinical Social Work is seeking post-discharge placement for this patient at the [place] level of care (*CSW will initial, date and re-position this form in  chart as items are completed):  Yes   Patient/family provided with [place] Work Department's list of facilities offering this level of care within the geographic area requested by the patient (or if unable, by the patient's family).  Yes   Patient/family informed of their freedom to choose among providers that offer the needed level of care, that participate in Medicare, Medicaid or managed care program needed by the patient, have an available bed and are willing to accept the patient.  Yes   Patient/family informed of [facility]'s ownership interest in [facility] and [facility], as well as of the fact that they are under no obligation to receive care at these facilities.  PASRR submitted to EDS on       PASRR number received on       Existing PASRR number confirmed on 11/24/16     FL2 transmitted to all facilities in geographic area requested by pt/family on 11/24/16     FL2 transmitted to all facilities within larger geographic area on       Patient informed that his/her managed care company has contracts with or will negotiate with certain facilities, including the following:        Yes   Patient/family informed of bed offers received.  Patient chooses bed at [facility], [place]     Physician recommends and patient chooses bed at [facility] [place]    Patient to be transferred to [facility] [place] on  .  Patient to be transferred to facility by       Patient family notified on   of transfer.  Name of family member notified:        PHYSICIAN Please sign FL2, Please  sign DNR     Additional Comment:    _______________________________________________ Lilly Cove, LCSW 11/24/2016, 2:08 PM

## 2016-11-24 NOTE — Progress Notes (Signed)
Talked to MD [facility], requested signed DNR form for transport.  Talked to Social work, arranged placement at [facility]. Called report [phone], talked to [place] for transport  Tymeka Privette

## 2016-11-24 NOTE — Progress Notes (Signed)
MD Wyline Copas called stated he printed a script for Vicodin. MD also stated pt will now be discharged to a SNF   Savannah Parks

## 2016-11-24 NOTE — NC FL2 (Signed)
[place] LEVEL OF CARE SCREENING TOOL     IDENTIFICATION  Patient Name: Savannah Parks Birthdate: 1911/03/23 Sex: female Admission Date (Current Location): 11/19/2016  [facility] and [place] Number:  Herbalist and Address:  The . [facility] [place] [address], [place], [place] [address]      Provider Number: [id number]  Attending Physician Name and Address:  Donne Hazel, MD  Relative Name and Phone Number:       Current Level of Care: Hospital Recommended Level of Care: [place] Prior Approval Number:    Date Approved/Denied:   PASRR Number:    [id number]  Discharge Plan: SNF    Current Diagnoses: Patient Active Problem List   Diagnosis Date Noted  . Cellulitis of leg, right 11/20/2016  . Anemia of chronic disease 11/20/2016  . GERD (gastroesophageal reflux disease) 11/20/2016  . Diastolic CHF, acute on chronic (HCC) 11/19/2016  . Failure to thrive in adult 11/19/2016  . Pulmonary embolism ([place]) 09/27/2016  . HCAP (healthcare-associated pneumonia) 09/27/2016  . Proximal humeral fracture 08/16/2016  . Essential hypertension 08/16/2016  . Bradycardia 08/16/2016  . Pressure injury of skin 08/16/2016  . Closed torus fracture of upper end of right humerus     Orientation RESPIRATION BLADDER Height & Weight     Self  Normal Incontinent Weight: 152 lb (68.9 kg) Height:  5\' 4"  (162.6 cm)  BEHAVIORAL SYMPTOMS/MOOD NEUROLOGICAL BOWEL NUTRITION STATUS      Incontinent Diet (SEE DC summary)  AMBULATORY STATUS COMMUNICATION OF NEEDS Skin   Supervision Verbally Normal                       Personal Care Assistance Level of Assistance  Bathing, Feeding, Dressing Bathing Assistance: Maximum assistance Feeding assistance: Limited assistance Dressing Assistance: Maximum assistance     Functional Limitations Info  Sight, Hearing, Speech Sight Info: Impaired Hearing Info: Impaired Speech Info: Adequate     SPECIAL CARE FACTORS FREQUENCY                       Contractures Contractures Info: Not present    Additional Factors Info  Code Status, Allergies Code Status Info: DNR Allergies Info: Tape, Keflet Cephalexin, Sulfa Antibiotics           Current Medications (11/24/2016):  This is the current hospital active medication list Current Facility-Administered Medications  Medication Dose Route Frequency Provider Last Rate Last Dose  . 0.9 %  sodium chloride infusion  250 mL Intravenous PRN Norval Morton, MD      . acetaminophen (TYLENOL) tablet 650 mg  650 mg Oral Q4H PRN Norval Morton, MD   650 mg at 11/24/16 0054  . acidophilus (RISAQUAD) capsule 2 capsule  2 capsule Oral TID Norval Morton, MD   2 capsule at 11/24/16 0909  . apixaban (ELIQUIS) tablet 2.5 mg  2.5 mg Oral BID Norval Morton, MD   2.5 mg at 11/24/16 0909  . calcium carbonate (OS-CAL - dosed in mg of elemental calcium) tablet 500 mg of elemental calcium  500 mg of elemental calcium Oral Q breakfast Rondell A Tamala Julian, MD   500 mg of elemental calcium at 11/24/16 0909  . clindamycin (CLEOCIN) capsule 150 mg  150 mg Oral Q6H Donne Hazel, MD   150 mg at 11/24/16 0538  . furosemide (LASIX) injection 40 mg  40 mg Intravenous Daily Donne Hazel, MD  40 mg at 11/24/16 0909  . HYDROcodone-acetaminophen (NORCO/VICODIN) 5-325 MG per tablet 0.5 tablet  0.5 tablet Oral Q4H PRN Norval Morton, MD   0.5 tablet at 11/23/16 2231  . levalbuterol (XOPENEX) nebulizer solution 0.63 mg  0.63 mg Nebulization Q6H PRN Norval Morton, MD      . magnesium hydroxide (MILK OF MAGNESIA) suspension 15 mL  15 mL Oral Daily PRN Donne Hazel, MD   15 mL at 11/23/16 1033  . naphazoline-pheniramine (NAPHCON-A) 0.025-0.3 % ophthalmic solution 1 drop  1 drop Both Eyes QID PRN Norval Morton, MD      . ondansetron (ZOFRAN) injection 4 mg  4 mg Intravenous Q6H PRN Norval Morton, MD   4 mg at 11/20/16 1024  . senna (SENOKOT) tablet 8.6 mg   1 tablet Oral QHS Norval Morton, MD   8.6 mg at 11/23/16 2226  . sodium chloride flush (NS) 0.9 % injection 3 mL  3 mL Intravenous Q12H Norval Morton, MD   3 mL at 11/24/16 0921  . sodium chloride flush (NS) 0.9 % injection 3 mL  3 mL Intravenous PRN Norval Morton, MD      . sucralfate (CARAFATE) tablet 1 g  1 g Oral BID Norval Morton, MD   1 g at 11/24/16 0909     Discharge Medications: Please see discharge summary for a list of discharge medications.  Relevant Imaging Results:  Relevant Lab Results:   Additional Information SS#:[id number]    Lilly Cove, LCSW

## 2016-11-24 NOTE — Progress Notes (Signed)
Pt ate applesauce and ensure with morning medications. Tolerated well. Pt states she did not want anything else to eat at this time. Will continue to monitor and try again later   Karren Newland

## 2016-11-24 NOTE — Clinical Social Work Note (Signed)
Clinical Social Work Assessment  Patient Details  Name: Savannah Parks MRN: [id number] Date of Birth: 09-Aug-1911  Date of referral:  11/24/16               Reason for consult:  Facility Placement, Discharge Planning, Family Concerns                Permission sought to share information with:  Case Manager, Customer service manager, Family Supports Permission granted to share information::  Yes, Verbal Permission Granted  Name::        Agency::  [facility]  Relationship::  Tyrone Nine (family)  [phone]  Contact Information:     Housing/Transportation Living arrangements for the past 2 months:  [place] of Information:  Medical Team, Case Manager, Adult Children Patient Interpreter Needed:  None Criminal Activity/Legal Involvement Pertinent to Current Situation/Hospitalization:  No - Comment as needed Significant Relationships:  Adult Children, Other Family Members Lives with:  Adult Children Do you feel safe going back to the place where you live?  No Need for family participation in patient care:  Yes (Comment)  Care giving concerns:  Family reports they were prepared to take her home with hospice.  At this time they feel she needs short term stay at rehab facility and open to paying privately.  Report due to their health and unable to lift patient, she may need SNF for 1-2 weeks.  Requesting a private pay quote.  Call placed to [facility]: heather regarding payment with SNF vs respite.  Message left, awaiting call back.   Social Worker assessment / plan:  LCSW received consult and follow up with family regarding concerns and needs. Would like quote for SNF. Will also see if she has any days left for insurance to cover a short stay however recommendation is HH from PT and supervision, thus it may not be a justifiable coverage and more of custodial care.  This was explained to family and they voice understanding.   Will follow up with SNF regarding payment.    Unclear of plan at this time.  Employment status:  Retired Nurse, adult PT Recommendations:  [place], Home with [facility] / Referral to community resources:  [place]  Patient/Family's Response to care:  Understanding and appreciative.  Patient/Family's Understanding of and Emotional Response to Diagnosis, Current Treatment, and Prognosis:  Family aware of prognosis and capabilities to take patient home.  Unclear of long term plan as SNF would be very short term.  Emotional Assessment Appearance:  Appears stated age Attitude/Demeanor/Rapport:    Affect (typically observed):  Quiet Orientation:  Oriented to Self Alcohol / Substance use:  Not Applicable Psych involvement (Current and /or in the community):  No (Comment)  Discharge Needs  Concerns to be addressed:  No discharge needs identified Readmission within the last 30 days:  No Current discharge risk:  None Barriers to Discharge:  Family Issues, Continued Medical Work up, [employer]   Marshell Garfinkel 11/24/2016, 12:36 PM

## 2016-12-14 ENCOUNTER — Emergency Department (HOSPITAL_COMMUNITY)
Admission: EM | Admit: 2016-12-14 | Discharge: 2016-12-14 | Disposition: A | Discharge status: Home / Self Care | Payer: Medicare Other | Attending: Emergency Medicine | Admitting: Emergency Medicine

## 2016-12-14 ENCOUNTER — Encounter (HOSPITAL_COMMUNITY): Payer: Self-pay

## 2016-12-14 ENCOUNTER — Emergency Department (HOSPITAL_COMMUNITY): Payer: Medicare Other

## 2016-12-14 DIAGNOSIS — Z8551 Personal history of malignant neoplasm of bladder: Secondary | ICD-10-CM | POA: Diagnosis not present

## 2016-12-14 DIAGNOSIS — Z79899 Other long term (current) drug therapy: Secondary | ICD-10-CM | POA: Diagnosis not present

## 2016-12-14 DIAGNOSIS — J90 Pleural effusion, not elsewhere classified: Secondary | ICD-10-CM | POA: Diagnosis not present

## 2016-12-14 DIAGNOSIS — R0602 Shortness of breath: Secondary | ICD-10-CM | POA: Insufficient documentation

## 2016-12-14 DIAGNOSIS — I110 Hypertensive heart disease with heart failure: Secondary | ICD-10-CM | POA: Diagnosis not present

## 2016-12-14 DIAGNOSIS — I5033 Acute on chronic diastolic (congestive) heart failure: Secondary | ICD-10-CM | POA: Diagnosis not present

## 2016-12-14 DIAGNOSIS — Z87891 Personal history of nicotine dependence: Secondary | ICD-10-CM | POA: Insufficient documentation

## 2016-12-14 DIAGNOSIS — Z7901 Long term (current) use of anticoagulants: Secondary | ICD-10-CM | POA: Diagnosis not present

## 2016-12-14 DIAGNOSIS — R05 Cough: Secondary | ICD-10-CM | POA: Diagnosis present

## 2016-12-14 LAB — COMPREHENSIVE METABOLIC PANEL
ALBUMIN: 2.4 g/dL — AB (ref 3.5–5.0)
ALT: 9 U/L — ABNORMAL LOW (ref 14–54)
ANION GAP: 7 (ref 5–15)
AST: 25 U/L (ref 15–41)
Alkaline Phosphatase: 65 U/L (ref 38–126)
BILIRUBIN TOTAL: 0.3 mg/dL (ref 0.3–1.2)
BUN: 12 mg/dL (ref 6–20)
CHLORIDE: 101 mmol/L (ref 101–111)
CO2: 29 mmol/L (ref 22–32)
Calcium: 9.2 mg/dL (ref 8.9–10.3)
Creatinine, Ser: 0.79 mg/dL (ref 0.44–1.00)
GFR calc Af Amer: >60 mL/min (ref >60)
GFR calc non Af Amer: >60 mL/min (ref >60)
Glucose, Bld: 114 mg/dL — ABNORMAL HIGH (ref 65–99)
POTASSIUM: 3.7 mmol/L (ref 3.5–5.1)
Sodium: 137 mmol/L (ref 135–145)
TOTAL PROTEIN: 5.7 g/dL — AB (ref 6.5–8.1)

## 2016-12-14 LAB — CBC WITH DIFFERENTIAL/PLATELET
BASOS ABS: 0 10*3/uL (ref 0.0–0.1)
BASOS PCT: 0 %
EOS ABS: 0.2 10*3/uL (ref 0.0–0.7)
EOS PCT: 3 %
HEMATOCRIT: 38 % (ref 36.0–46.0)
Hemoglobin: 11.5 g/dL — ABNORMAL LOW (ref 12.0–15.0)
Lymphocytes Relative: 24 %
Lymphs Abs: 2.1 10*3/uL (ref 0.7–4.0)
MCH: 24.1 pg — ABNORMAL LOW (ref 26.0–34.0)
MCHC: 30.3 g/dL (ref 30.0–36.0)
MCV: 79.5 fL (ref 78.0–100.0)
MONO ABS: 0.7 10*3/uL (ref 0.1–1.0)
MONOS PCT: 8 %
Neutro Abs: 5.8 10*3/uL (ref 1.7–7.7)
Neutrophils Relative %: 65 %
PLATELETS: 300 10*3/uL (ref 150–400)
RBC: 4.78 MIL/uL (ref 3.87–5.11)
RDW: 18.6 % — AB (ref 11.5–15.5)
WBC: 8.8 10*3/uL (ref 4.0–10.5)

## 2016-12-14 LAB — URINALYSIS, ROUTINE W REFLEX MICROSCOPIC
Bilirubin Urine: NEGATIVE
GLUCOSE, UA: NEGATIVE mg/dL
HGB URINE DIPSTICK: NEGATIVE
Ketones, ur: NEGATIVE mg/dL
LEUKOCYTES UA: NEGATIVE
Nitrite: NEGATIVE
PROTEIN: NEGATIVE mg/dL
Specific Gravity, Urine: 1.013 (ref 1.005–1.030)
pH: 8 (ref 5.0–8.0)

## 2016-12-14 LAB — I-STAT TROPONIN, ED: Troponin i, poc: 0.01 ng/mL (ref 0.00–0.08)

## 2016-12-14 LAB — BRAIN NATRIURETIC PEPTIDE: B NATRIURETIC PEPTIDE 5: 148.2 pg/mL — AB (ref 0.0–100.0)

## 2016-12-14 MED ORDER — ONDANSETRON HCL 4 MG/2ML IJ SOLN
4.0000 mg | Freq: Once | INTRAMUSCULAR | Status: AC
  Administered 2016-12-14: 4 mg via INTRAVENOUS
  Filled 2016-12-14: qty 2

## 2016-12-14 MED ORDER — FUROSEMIDE 10 MG/ML IJ SOLN
20.0000 mg | Freq: Once | INTRAMUSCULAR | Status: AC
  Administered 2016-12-14: 20 mg via INTRAVENOUS
  Filled 2016-12-14: qty 2

## 2016-12-14 MED ORDER — SODIUM CHLORIDE 0.9 % IV BOLUS (SEPSIS)
500.0000 mL | Freq: Once | INTRAVENOUS | Status: AC
  Administered 2016-12-14: 500 mL via INTRAVENOUS

## 2016-12-14 MED ORDER — FUROSEMIDE 20 MG PO TABS: 20.0000 mg | ORAL_TABLET | Freq: Every day | ORAL | 0 refills | Status: DC

## 2016-12-14 NOTE — Discharge Instructions (Signed)
Take lasix 20 mg daily x 2 days.   You have some fluid in your lungs likely causing your cough.   Call your doctor tomorrow for an appointment in 2-3 days   Return to ER if you have worse cough, trouble breathing, shortness of breath, fevers.

## 2016-12-14 NOTE — ED Notes (Signed)
Pt had large BM prior to attempting urine. Pt was cleaned with soap and water and changed into clean brief.

## 2016-12-14 NOTE — ED Notes (Signed)
Pt being transported to x-ray

## 2016-12-14 NOTE — ED Triage Notes (Signed)
Pt presents with generalized malaise and nausea that began this morning.  Pt was just discharged from [facility] s/p fall with R arm fracture.  On arrival, pt denies any pain.

## 2016-12-14 NOTE — ED Provider Notes (Addendum)
[facility] DEPT Provider Note   CSN: [id number] Arrival date & time: 12/14/16  1138     History   Chief Complaint No chief complaint on file.   HPI Savannah Parks is a 83 y.o. female hx of HTN, PE on eliquis, Here presenting with cough, spitting up. Patient was admitted to the hospital about 2 weeks ago and had leg swelling at that time and possible leg cellulitis. Patient finished a course of clindamycin, patient went to a rehabilitation center just got discharged home several days ago. She lives at home with her daughter. This morning, patient had a coughing spell and was spitting up some clear sputum. Started about got there and she had O2 90-91% on room air. Patient just feels weak all over but denies any fevers at home or vomiting. Denies any urinary symptoms. States that the leg swelling has improved since the last admission.  The history is provided by the patient and a relative.    Past Medical History:  Diagnosis Date  . Cancer [facility])    Bladder  . Hiatal hernia   . High cholesterol   . HOH (hard of hearing)   . Hypertension   . Legally blind   . PE (pulmonary thromboembolism) ([place]) 09/2016  . Sinus drainage     Patient Active Problem List   Diagnosis Date Noted  . Cellulitis of leg, right 11/20/2016  . Anemia of chronic disease 11/20/2016  . GERD (gastroesophageal reflux disease) 11/20/2016  . Diastolic CHF, acute on chronic (HCC) 11/19/2016  . Failure to thrive in adult 11/19/2016  . Pulmonary embolism ([place]) 09/27/2016  . HCAP (healthcare-associated pneumonia) 09/27/2016  . Proximal humeral fracture 08/16/2016  . Essential hypertension 08/16/2016  . Bradycardia 08/16/2016  . Pressure injury of skin 08/16/2016  . Closed torus fracture of upper end of right humerus     Past Surgical History:  Procedure Laterality Date  . BLADDER REMOVAL  Nov. 1993   part  . DILATION AND CURETTAGE OF UTERUS     50+ years    OB History    No data available        Home Medications    Prior to Admission medications   Medication Sig Start Date End Date Taking? Authorizing Provider  acetaminophen (TYLENOL) 325 MG tablet Take 2 tablets (650 mg total) by mouth every 6 (six) hours as needed for mild pain (or Fever >/= 101). 09/30/16   Robbie Lis, MD  acidophilus (RISAQUAD) CAPS capsule Take 2 capsules by mouth 3 (three) times daily. 11/24/16   Donne Hazel, MD  alendronate (FOSAMAX) 70 MG tablet Take 70 mg by mouth every Tuesday. Take usually on Mondays or Tuesdays 07/25/16   Historical Provider, MD  apixaban (ELIQUIS) 5 MG TABS tablet Take 1 tablet (5 mg total) by mouth 2 (two) times daily. Patient taking differently: Take 2.5 mg by mouth 2 (two) times daily.  10/07/16   Robbie Lis, MD  Calcium Carbonate (CALCIUM 600 PO) Take 1 tablet by mouth daily.     Historical Provider, MD  Cholecalciferol (VITAMIN D-3) 1000 units CAPS Take 1 capsule by mouth daily.    Historical Provider, MD  clindamycin (CLEOCIN) 150 MG capsule Take 1 capsule (150 mg total) by mouth every 6 (six) hours. 11/24/16   Donne Hazel, MD  docusate sodium (COLACE) 100 MG capsule Take 100 mg by mouth daily as needed for mild constipation.    Historical Provider, MD  furosemide (LASIX) 40 MG tablet Take 1  tablet (40 mg total) by mouth daily. 11/24/16   Donne Hazel, MD  HYDROcodone-acetaminophen (NORCO/VICODIN) 5-325 MG tablet Take 0.5 tablets by mouth at bedtime as needed for pain. 11/10/16   Historical Provider, MD  HYDROcodone-acetaminophen (NORCO/VICODIN) 5-325 MG tablet Take 0.5 tablets by mouth every 4 (four) hours as needed for moderate pain. 11/24/16   Donne Hazel, MD  naphazoline-pheniramine (NAPHCON-A) 0.025-0.3 % ophthalmic solution Place 1 drop into both eyes 4 (four) times daily as needed for irritation or allergies.     Historical Provider, MD  senna (SENOKOT) 8.6 MG TABS tablet Take 1 tablet (8.6 mg total) by mouth at bedtime. 09/30/16   Robbie Lis, MD  sucralfate  (CARAFATE) 1 g tablet Take 1 g by mouth 2 (two) times daily as needed (stomach).  11/10/16   Historical Provider, MD  triamcinolone cream (KENALOG) 0.1 % Apply 1 application topically daily as needed (rash). TO LEGS 07/28/16   Historical Provider, MD    Family History Family History  Problem Relation Age of Onset  . Cancer Father   . Cancer Sister   . Cancer Brother     Social History Social History  Substance Use Topics  . Smoking status: Former Research scientist (life sciences)  . Smokeless tobacco: Former Systems developer    Types: Snuff  . Alcohol use No     Allergies   Tape; Keflet [cephalexin]; and Sulfa antibiotics   Review of Systems Review of Systems  Respiratory: Positive for cough.   Neurological: Positive for weakness.  All other systems reviewed and are negative.    Physical Exam Updated Vital Signs BP (!) 181/72   Pulse 72   Temp 97.6 F (36.4 C) (Oral)   Resp 20   SpO2 93%   Physical Exam  Constitutional:  Well appearing for age   HENT:  Head: Normocephalic.  Mouth/Throat: Oropharynx is clear and moist.  Eyes: EOM are normal. Pupils are equal, round, and reactive to light.  Neck: Normal range of motion. Neck supple.  Cardiovascular: Normal rate and regular rhythm.   Pulmonary/Chest: Effort normal and breath sounds normal. No respiratory distress. She has no wheezes.  Abdominal: Soft. Bowel sounds are normal. She exhibits no distension. There is no tenderness.  Musculoskeletal: Normal range of motion.  Neurological: She is alert.  Pleasant, demented. Nl strength throughout. CN 2-12 intact   Skin: Skin is warm.  Psychiatric: She has a normal mood and affect.  Nursing note and vitals reviewed.    ED Treatments / Results  Labs (all labs ordered are listed, but only abnormal results are displayed) Labs Reviewed  CBC WITH DIFFERENTIAL/PLATELET - Abnormal; Notable for the following:       Result Value   Hemoglobin 11.5 (*)    MCH 24.1 (*)    RDW 18.6 (*)    All other components  within normal limits  COMPREHENSIVE METABOLIC PANEL - Abnormal; Notable for the following:    Glucose, Bld 114 (*)    Total Protein 5.7 (*)    Albumin 2.4 (*)    ALT 9 (*)    All other components within normal limits  URINALYSIS, ROUTINE W REFLEX MICROSCOPIC - Abnormal; Notable for the following:    APPearance CLOUDY (*)    All other components within normal limits  BRAIN NATRIURETIC PEPTIDE - Abnormal; Notable for the following:    B Natriuretic Peptide 148.2 (*)    All other components within normal limits  URINE CULTURE  I-STAT TROPOININ, ED    EKG  EKG Interpretation  Date/Time:  Sunday December 14 2016 11:46:28 EDT Ventricular Rate:  66 PR Interval:    QRS Duration: 139 QT Interval:  434 QTC Calculation: 455 R Axis:   90 Text Interpretation:  Sinus rhythm Right bundle branch block No significant change since last tracing Confirmed by Braylie Badami  MD, Jonuel Butterfield ([id number]) on 12/14/2016 12:02:04 PM       Radiology Dg Chest 2 View  Result Date: 12/14/2016 CLINICAL DATA:  Shortness of breath and chest pain. EXAM: CHEST  2 VIEW COMPARISON:  November 19, 2016 FINDINGS: Stable cardiomegaly. Small effusions with underlying atelectasis. No overt edema. No nodule, masses, or focal infiltrate. Chronic unhealed right proximal humeral fracture. IMPRESSION: Small pleural effusions with underlying atelectasis. Electronically Signed   By: Dorise Bullion III M.D   On: 12/14/2016 12:50    Procedures Procedures (including critical care time)  Medications Ordered in ED Medications  furosemide (LASIX) injection 20 mg (not administered)  sodium chloride 0.9 % bolus 500 mL (0 mLs Intravenous Stopped 12/14/16 1451)  ondansetron (ZOFRAN) injection 4 mg (4 mg Intravenous Given 12/14/16 1254)     Initial Impression / Assessment and Plan / ED Course  I have reviewed the triage vital signs and the nursing notes.  Pertinent labs & imaging results that were available during my care of the patient were reviewed by me  and considered in my medical decision making (see chart for details).    Savannah Parks is a 83 y.o. female here with cough, weakness. Afebrile, well appearing for age. Will check labs, UA, CXR to r/o infection vs electrolyte imbalance. No signs of recurrent cellulitis. She is on eliquis and not hypoxic or tachycardic, I doubt PE.    3:40 PM CXR showed small pleural effusion. BNP 148 and was 100 last admission. Not hypoxic in the ED. Well appearing. Given lasix 20 mg IV. Will dc home with lasix 20 mg daily x 2 days. Will have her follow up with PCP outpatient and given strict return precautions.   Final Clinical Impressions(s) / ED Diagnoses   Final diagnoses:  None    New Prescriptions New Prescriptions   No medications on file     Drenda Freeze, MD 12/14/16 [place] Makina Skow, MD 12/14/16 [phone]

## 2016-12-15 LAB — URINE CULTURE: CULTURE: NO GROWTH

## 2017-01-21 ENCOUNTER — Emergency Department (HOSPITAL_BASED_OUTPATIENT_CLINIC_OR_DEPARTMENT_OTHER)
Admit: 2017-01-21 | Discharge: 2017-01-21 | Disposition: A | Discharge status: Home / Self Care | Payer: Medicare Other | Attending: Emergency Medicine | Admitting: Emergency Medicine

## 2017-01-21 ENCOUNTER — Emergency Department (HOSPITAL_COMMUNITY)
Admission: EM | Admit: 2017-01-21 | Discharge: 2017-01-21 | Disposition: A | Discharge status: Home / Self Care | Payer: Medicare Other | Attending: Emergency Medicine | Admitting: Emergency Medicine

## 2017-01-21 ENCOUNTER — Encounter (HOSPITAL_COMMUNITY): Payer: Self-pay | Admitting: Obstetrics and Gynecology

## 2017-01-21 DIAGNOSIS — Z87891 Personal history of nicotine dependence: Secondary | ICD-10-CM | POA: Diagnosis not present

## 2017-01-21 DIAGNOSIS — M7989 Other specified soft tissue disorders: Secondary | ICD-10-CM

## 2017-01-21 DIAGNOSIS — R2681 Unsteadiness on feet: Secondary | ICD-10-CM

## 2017-01-21 DIAGNOSIS — Z79899 Other long term (current) drug therapy: Secondary | ICD-10-CM | POA: Diagnosis not present

## 2017-01-21 DIAGNOSIS — I110 Hypertensive heart disease with heart failure: Secondary | ICD-10-CM | POA: Insufficient documentation

## 2017-01-21 DIAGNOSIS — E876 Hypokalemia: Secondary | ICD-10-CM | POA: Insufficient documentation

## 2017-01-21 DIAGNOSIS — M79662 Pain in left lower leg: Secondary | ICD-10-CM

## 2017-01-21 DIAGNOSIS — Z7901 Long term (current) use of anticoagulants: Secondary | ICD-10-CM | POA: Diagnosis not present

## 2017-01-21 DIAGNOSIS — I5033 Acute on chronic diastolic (congestive) heart failure: Secondary | ICD-10-CM | POA: Diagnosis not present

## 2017-01-21 DIAGNOSIS — R269 Unspecified abnormalities of gait and mobility: Secondary | ICD-10-CM | POA: Insufficient documentation

## 2017-01-21 LAB — CBC WITH DIFFERENTIAL/PLATELET
BASOS ABS: 0 10*3/uL (ref 0.0–0.1)
Basophils Relative: 0 %
EOS ABS: 0.1 10*3/uL (ref 0.0–0.7)
EOS PCT: 1 %
HCT: 37.8 % (ref 36.0–46.0)
HEMOGLOBIN: 12 g/dL (ref 12.0–15.0)
LYMPHS PCT: 22 %
Lymphs Abs: 1.9 10*3/uL (ref 0.7–4.0)
MCH: 25.6 pg — AB (ref 26.0–34.0)
MCHC: 31.7 g/dL (ref 30.0–36.0)
MCV: 80.6 fL (ref 78.0–100.0)
Monocytes Absolute: 1.1 10*3/uL — ABNORMAL HIGH (ref 0.1–1.0)
Monocytes Relative: 12 %
NEUTROS PCT: 65 %
Neutro Abs: 5.8 10*3/uL (ref 1.7–7.7)
PLATELETS: 234 10*3/uL (ref 150–400)
RBC: 4.69 MIL/uL (ref 3.87–5.11)
RDW: 17.6 % — ABNORMAL HIGH (ref 11.5–15.5)
WBC: 8.9 10*3/uL (ref 4.0–10.5)

## 2017-01-21 LAB — BASIC METABOLIC PANEL
ANION GAP: 8 (ref 5–15)
BUN: 12 mg/dL (ref 6–20)
CHLORIDE: 97 mmol/L — AB (ref 101–111)
CO2: 32 mmol/L (ref 22–32)
Calcium: 8.8 mg/dL — ABNORMAL LOW (ref 8.9–10.3)
Creatinine, Ser: 0.83 mg/dL (ref 0.44–1.00)
GFR calc Af Amer: >60 mL/min (ref >60)
GFR, EST NON AFRICAN AMERICAN: 55 mL/min — AB (ref >60)
Glucose, Bld: 108 mg/dL — ABNORMAL HIGH (ref 65–99)
POTASSIUM: 2.8 mmol/L — AB (ref 3.5–5.1)
SODIUM: 137 mmol/L (ref 135–145)

## 2017-01-21 LAB — URINALYSIS, ROUTINE W REFLEX MICROSCOPIC
Bilirubin Urine: NEGATIVE
Glucose, UA: NEGATIVE mg/dL
HGB URINE DIPSTICK: NEGATIVE
Ketones, ur: NEGATIVE mg/dL
LEUKOCYTES UA: NEGATIVE
NITRITE: NEGATIVE
PROTEIN: NEGATIVE mg/dL
SPECIFIC GRAVITY, URINE: 1.009 (ref 1.005–1.030)
pH: 8 (ref 5.0–8.0)

## 2017-01-21 MED ORDER — HYDROCODONE-ACETAMINOPHEN 5-325 MG PO TABS: 0.5000 | ORAL_TABLET | ORAL | Status: DC | PRN

## 2017-01-21 MED ORDER — FUROSEMIDE 40 MG PO TABS
40.0000 mg | ORAL_TABLET | Freq: Every day | ORAL | Status: DC
  Administered 2017-01-21: 40 mg via ORAL
  Filled 2017-01-21: qty 1

## 2017-01-21 MED ORDER — TRIAMCINOLONE ACETONIDE 0.1 % EX CREA: 1.0000 "application " | TOPICAL_CREAM | Freq: Every day | CUTANEOUS | Status: DC | PRN

## 2017-01-21 MED ORDER — ALENDRONATE SODIUM 70 MG PO TABS: 70.0000 mg | ORAL_TABLET | ORAL | Status: DC

## 2017-01-21 MED ORDER — POTASSIUM CHLORIDE CRYS ER 20 MEQ PO TBCR
40.0000 meq | EXTENDED_RELEASE_TABLET | Freq: Once | ORAL | Status: AC
  Administered 2017-01-21: 40 meq via ORAL
  Filled 2017-01-21: qty 2

## 2017-01-21 MED ORDER — POTASSIUM CHLORIDE ER 10 MEQ PO TBCR: 20.0000 meq | EXTENDED_RELEASE_TABLET | Freq: Every day | ORAL | 0 refills | Status: DC

## 2017-01-21 MED ORDER — RISAQUAD PO CAPS
2.0000 | ORAL_CAPSULE | Freq: Three times a day (TID) | ORAL | Status: DC
  Administered 2017-01-21: 2 via ORAL
  Filled 2017-01-21 (×2): qty 2

## 2017-01-21 MED ORDER — FUROSEMIDE 40 MG PO TABS
20.0000 mg | ORAL_TABLET | Freq: Every day | ORAL | Status: DC
  Administered 2017-01-21: 20 mg via ORAL
  Filled 2017-01-21: qty 1

## 2017-01-21 MED ORDER — NAPHAZOLINE-PHENIRAMINE 0.025-0.3 % OP SOLN
1.0000 [drp] | Freq: Four times a day (QID) | OPHTHALMIC | Status: DC | PRN
  Filled 2017-01-21: qty 5

## 2017-01-21 MED ORDER — ACETAMINOPHEN 325 MG PO TABS: 650.0000 mg | ORAL_TABLET | Freq: Four times a day (QID) | ORAL | Status: DC | PRN

## 2017-01-21 MED ORDER — SENNA 8.6 MG PO TABS: 1.0000 | ORAL_TABLET | Freq: Every day | ORAL | Status: DC

## 2017-01-21 MED ORDER — SUCRALFATE 1 G PO TABS: 1.0000 g | ORAL_TABLET | Freq: Two times a day (BID) | ORAL | Status: DC | PRN

## 2017-01-21 MED ORDER — APIXABAN 2.5 MG PO TABS
2.5000 mg | ORAL_TABLET | Freq: Two times a day (BID) | ORAL | Status: DC
  Filled 2017-01-21: qty 1

## 2017-01-21 NOTE — ED Notes (Signed)
PT at bedside.

## 2017-01-21 NOTE — NC FL2 (Signed)
[place] LEVEL OF CARE SCREENING TOOL     IDENTIFICATION  Patient Name: Savannah Parks Birthdate: [birth year] Sex: female Admission Date (Current Location): 01/21/2017  [facility] [place] and [place] Number:  Herbalist and Address:  [facility],  [place], [place]      Provider Number: [id number]  Attending Physician Name and Address:  Lacretia Leigh, MD  Relative Name and Phone Number:       Current Level of Care: Hospital Recommended Level of Care: [place] Prior Approval Number:    Date Approved/Denied: 08/17/16 PASRR Number: [id number]  Discharge Plan: SNF    Current Diagnoses: Patient Active Problem List   Diagnosis Date Noted  . Cellulitis of leg, right 11/20/2016  . Anemia of chronic disease 11/20/2016  . GERD (gastroesophageal reflux disease) 11/20/2016  . Diastolic CHF, acute on chronic (HCC) 11/19/2016  . Failure to thrive in adult 11/19/2016  . Pulmonary embolism ([place]) 09/27/2016  . HCAP (healthcare-associated pneumonia) 09/27/2016  . Proximal humeral fracture 08/16/2016  . Essential hypertension 08/16/2016  . Bradycardia 08/16/2016  . Pressure injury of skin 08/16/2016  . Closed torus fracture of upper end of right humerus     Orientation RESPIRATION BLADDER Height & Weight     Self, Place  Normal Incontinent Weight:   Height:     BEHAVIORAL SYMPTOMS/MOOD NEUROLOGICAL BOWEL NUTRITION STATUS      Incontinent  (Regular)  AMBULATORY STATUS COMMUNICATION OF NEEDS Skin   Limited Assist Verbally  (one wound on lower leg, almost healed.)                       Personal Care Assistance Level of Assistance  Bathing, Dressing Bathing Assistance: Limited assistance   Dressing Assistance: Limited assistance     Functional Limitations Info             SPECIAL CARE FACTORS FREQUENCY                       Contractures Contractures Info: Not present    Additional Factors  Info  Code Status, Allergies Code Status Info: Prior Allergies Info: Keflet Cephalexin, SULFA ANTIBIOTICS, Tape           Current Medications (01/21/2017):  This is the current hospital active medication list Current Facility-Administered Medications  Medication Dose Route Frequency Provider Last Rate Last Dose  . acetaminophen (TYLENOL) tablet 650 mg  650 mg Oral Q6H PRN Lacretia Leigh, MD      . acidophilus (RISAQUAD) capsule 2 capsule  2 capsule Oral TID Lacretia Leigh, MD      . Derrill Memo ON 01/27/2017] alendronate (FOSAMAX) tablet 70 mg  70 mg Oral Q Mikey College, MD      . apixaban Arne [place]) tablet 2.5 mg  2.5 mg Oral BID Lacretia Leigh, MD      . furosemide (LASIX) tablet 20 mg  20 mg Oral Daily Lacretia Leigh, MD      . furosemide (LASIX) tablet 40 mg  40 mg Oral Daily Lacretia Leigh, MD      . HYDROcodone-acetaminophen (NORCO/VICODIN) 5-325 MG per tablet 0.5 tablet  0.5 tablet Oral Q4H PRN Lacretia Leigh, MD      . naphazoline-pheniramine (NAPHCON-A) 0.025-0.3 % ophthalmic solution 1 drop  1 drop Both Eyes QID PRN Lacretia Leigh, MD      . senna [facility]) tablet 8.6 mg  1 tablet Oral QHS Lacretia Leigh, MD      .  sucralfate (CARAFATE) tablet 1 g  1 g Oral BID PRN Lacretia Leigh, MD      . triamcinolone cream (KENALOG) 0.1 % 1 application  1 application Topical Daily PRN Lacretia Leigh, MD       Current Outpatient Prescriptions  Medication Sig Dispense Refill  . acetaminophen (TYLENOL) 325 MG tablet Take 2 tablets (650 mg total) by mouth every 6 (six) hours as needed for mild pain (or Fever >/= 101). 30 tablet 0  . acidophilus (RISAQUAD) CAPS capsule Take 2 capsules by mouth 3 (three) times daily. 30 capsule 0  . alendronate (FOSAMAX) 70 MG tablet Take 70 mg by mouth every Tuesday. Take usually on Mondays or Tuesdays    . apixaban (ELIQUIS) 5 MG TABS tablet Take 1 tablet (5 mg total) by mouth 2 (two) times daily. (Patient taking differently: Take 2.5 mg by mouth 2 (two) times  daily. ) 60 tablet 0  . Calcium Carbonate (CALCIUM 600 PO) Take 1 tablet by mouth daily.     . Cholecalciferol (VITAMIN D-3) 1000 units CAPS Take 1 capsule by mouth daily.    Marland Kitchen docusate sodium (COLACE) 100 MG capsule Take 100 mg by mouth daily as needed for mild constipation.    . furosemide (LASIX) 20 MG tablet Take 1 tablet (20 mg total) by mouth daily. 2 tablet 0  . furosemide (LASIX) 40 MG tablet Take 1 tablet (40 mg total) by mouth daily. 30 tablet 0  . HYDROcodone-acetaminophen (NORCO/VICODIN) 5-325 MG tablet Take 0.5 tablets by mouth every 4 (four) hours as needed for moderate pain. 2 tablet 0  . naphazoline-pheniramine (NAPHCON-A) 0.025-0.3 % ophthalmic solution Place 1 drop into both eyes 4 (four) times daily as needed for irritation or allergies.     Marland Kitchen senna (SENOKOT) 8.6 MG TABS tablet Take 1 tablet (8.6 mg total) by mouth at bedtime. 30 each 0  . sucralfate (CARAFATE) 1 g tablet Take 1 g by mouth 2 (two) times daily as needed (stomach).     . triamcinolone cream (KENALOG) 0.1 % Apply 1 application topically daily as needed (rash). TO LEGS    . clindamycin (CLEOCIN) 150 MG capsule Take 1 capsule (150 mg total) by mouth every 6 (six) hours. (Patient not taking: Reported on 01/21/2017) 12 capsule 0  . potassium chloride (K-DUR) 10 MEQ tablet Take 2 tablets (20 mEq total) by mouth daily. 14 tablet 0     Discharge Medications: Please see discharge summary for a list of discharge medications.  Relevant Imaging Results:  Relevant Lab Results:   Additional Information [id number]  Alphonse Guild Kelena Garrow, LCSWA

## 2017-01-21 NOTE — Evaluation (Signed)
Physical Therapy Evaluation Patient Details Name: Savannah Parks MRN: [id number] DOB: 05-Feb-1911 Today's Date: 01/21/2017   History of Present Illness  PT admitted due to the inabilityt o stand amnd ambulate with family this morning and today. REports pain in her left leg and in B LEs due to swelling, reddness and weeping.   Clinical Impression  Very pleasant patient in with increased weakness and BLE pain with Left pain down the back of her leg increasing resulting in difficulty transferring and walking today (per family). Pt;s family reports that since her fall in Dec , her daughter has been staying with her. She had rehab and then returned home and then another hospitalization in March with rehab (at [facility] following) of which she just returned back home with family April 2nd. Since then she has been receiving [facility] services and walking  with RW short distance int eh home, including yesterday. However she has had greater difficulty and could not stand or move last night or today.  Upon evaluation pt did have pain and great difficulty moving the LLE , with decreased ability in weight bearing and standing at EOB with assistance. PT to benefit from continued rehab to assist with pain , increasing strength, and increasing ability with  mobility.     Follow Up Recommendations SNF    Equipment Recommendations  None recommended by PT    Recommendations for Other Services       Precautions / Restrictions Precautions Precautions: None (fell back in Dec 2017, and has had rehab since then) Restrictions Weight Bearing Restrictions: No      Mobility  Bed Mobility Overal bed mobility: Needs Assistance Bed Mobility: Supine to Sit;Sit to Supine     Supine to sit: Mod assist Sit to supine: Max assist (only due to the structure of the ER stretcher is difficult and too high for patient. )   General bed mobility comments: assisted with LES and upper body in intervals and cues. SLOW , but patient  assisted with as much as she could.   Transfers Overall transfer level: Needs assistance Equipment used: 2 person hand held assist (with B forearm support and under her arms to support and deweight ) Transfers: Sit to/from Stand Sit to Stand: Mod assist         General transfer comment: not a complete lift off ( 60 % lift off) due to weakness in BLES  and structure and height of ER stretcher very high for safety to sit back down .   Ambulation/Gait Ambulation/Gait assistance:  (unable to during this session )              Stairs            Wheelchair Mobility    Modified Rankin (Stroke Patients Only)       Balance                                             Pertinent Vitals/Pain Pain Assessment: 0-10 Pain Score: 4  Pain Location: she has pain in BLEs lowever legs with her right lower leg with the wound worse than the left LE. However great pain down the back of her left leg "all the way down" .  Pain Descriptors / Indicators: Aching Pain Intervention(s): Limited activity within patient's tolerance;Monitored during session    Home Living Family/patient expects to be discharged to:: Private residence (  pt's daughter has lived with herr since Dec, and pt's son lives next door. ) Living Arrangements: Spouse/significant other Available Help at Discharge: Family;Available 24 hours/day Type of Home: House Home Access: Stairs to enter Entrance Stairs-Rails: None (family states she was doign these with both of their help ) Entrance Stairs-Number of Steps: 1 + 1 Home Layout: One level Home Equipment: Walker - 4 wheels;Walker - 2 wheels;Hospital bed Additional Comments: Pt's son lives next door.     Prior Function           Comments: Pt mod I living alone prior to fall in Dec 2017. Following SNF stay, pt returned home min assist for all ADLs and mobility short distances in home. Then she was back in the hospital in [place] and went to [facility] until  [facility] 2nd. REturned hoem and was having [facility] therapy and aides and used a RW to walk in home with family assisting . Family wishes to take her back home eventually but open to what is best for pateint and them.      Hand Dominance   Dominant Hand: Right    Extremity/Trunk Assessment   Upper Extremity Assessment Upper Extremity Assessment: Defer to OT evaluation    Lower Extremity Assessment Lower Extremity Assessment: LLE deficits/detail;Generalized weakness LLE Deficits / Details: Left knee and hip range with increaed stiffness to only grossly 40 degree flexion in supine, but dangling EOB with about 70 dgrees flexion. Ankle range WNL. and then generalized weakness BLES.        Communication   Communication: HOH;No difficulties (however if you speak to Left ear and clearly she is very sharp in her mentation)  Cognition Arousal/Alertness: Awake/alert Behavior During Therapy: WFL for tasks assessed/performed Overall Cognitive Status: Within Functional Limits for tasks assessed                                        General Comments      Exercises     Assessment/Plan    PT Assessment Patient needs continued PT services  PT Problem List Decreased strength;Decreased range of motion;Decreased activity tolerance;Decreased mobility       PT Treatment Interventions Gait training;Functional mobility training;Therapeutic activities;Therapeutic exercise;Patient/family education    PT Goals (Current goals can be found in the Care Plan section)  Acute Rehab PT Goals Patient Stated Goal: I would like to go back home whne I am able  PT Goal Formulation: With patient/family Time For Goal Achievement: 02/04/17 Potential to Achieve Goals: Fair    Frequency Min 3X/week   Barriers to discharge        Co-evaluation               AM-PAC PT "6 Clicks" Daily Activity  Outcome Measure Difficulty turning over in bed (including adjusting bedclothes, sheets and  blankets)?: A Little Difficulty moving from lying on back to sitting on the side of the bed? : A Lot Difficulty sitting down on and standing up from a chair with arms (e.g., wheelchair, bedside commode, etc,.)?: A Lot Help needed moving to and from a bed to chair (including a wheelchair)?: A Lot Help needed walking in hospital room?: A Lot Help needed climbing 3-5 steps with a railing? : Total 6 Click Score: 12    End of Session   Activity Tolerance: Patient tolerated treatment well Patient left: in bed;with call bell/phone within reach;with family/visitor present Nurse Communication:  Mobility status PT Visit Diagnosis: Muscle weakness (generalized) (M62.81);Difficulty in walking, not elsewhere classified (R26.2)    Time: 1638-4665 PT Time Calculation (min) (ACUTE ONLY): 17 min   Charges:   PT Evaluation $PT Eval Low Complexity: 1 Procedure     PT G Codes:   PT G-Codes **NOT FOR INPATIENT CLASS** Functional Assessment Tool Used: AM-PAC 6 Clicks Basic Mobility;Clinical judgement Functional Limitation: Mobility: Walking and moving around Mobility: Walking and Moving Around Current Status (L9357): At least 60 percent but less than 80 percent impaired, limited or restricted Mobility: Walking and Moving Around Goal Status [phone]): At least 1 percent but less than 20 percent impaired, limited or restricted      Clide Dales 01/21/2017, 4:51 PM Clide Dales, PT Pager: [phone] 01/21/2017

## 2017-01-21 NOTE — Progress Notes (Signed)
PHARMACIST - PHYSICIAN COMMUNICATION  CONCERNING: P&T Medication Policy Regarding Oral Bisphosphonates  RECOMMENDATION: Your order for alendronate (Fosamax), ibandronate (Boniva), or risedronate (Actonel) has been discontinued at this time.  If the patient's post-hospital medical condition warrants safe use of this class of drugs, please resume the pre-hospital regimen upon discharge.  DESCRIPTION:  Alendronate (Fosamax), ibandronate (Boniva), and risedronate (Actonel) can cause severe esophageal erosions in patients who are unable to remain upright at least 30 minutes after taking this medication.   Since brief interruptions in therapy are thought to have minimal impact on bone mineral density, the [facility] has established that bisphosphonate orders should be routinely discontinued during hospitalization.   To override this safety policy and permit administration of Boniva, Fosamax, or Actonel in the hospital, prescribers must write "DO NOT HOLD" in the comments section when placing the order for this class of medications.   Peggyann Juba, PharmD, [place] [phone] 01/21/2017 4:45 PM

## 2017-01-21 NOTE — ED Notes (Signed)
Family at bedside. 

## 2017-01-21 NOTE — Progress Notes (Addendum)
ED CSW received request from medical EDP for consult on pt, via daytime ED CSW.  CSW following up shortly.  Alphonse Guild. Nikko Quast, Latanya Presser, LCAS Clinical Social Worker Ph: [phone]

## 2017-01-21 NOTE — ED Notes (Signed)
[facility] called for transport.  

## 2017-01-21 NOTE — Progress Notes (Signed)
EDP placed PT consult, CSW spoke to PT who are following up shortly.  EDP requested pt be assisted with placement.  Pt does not meet inpatient criteria at this time.   Savannah Parks. Emmely Bittinger, Latanya Presser, LCAS Clinical Social Worker Ph: [phone]

## 2017-01-21 NOTE — ED Provider Notes (Signed)
[facility] DEPT Provider Note   CSN: [id number] Arrival date & time: 01/21/17  1217     History   Chief Complaint Chief Complaint  Patient presents with  . Leg Pain    HPI Savannah Parks is a 83 y.o. female.  83 y/o here with left le swelling and pain x 1 week Patient denies any recent fever chills vomiting or diarrhea. Patient denies any trauma. Has chronic lower leg edema. Denies any chest discomfort or shortness of breath. Symptoms persistent and nothing makes them better or worse. No treatment use prior to arrival.      Past Medical History:  Diagnosis Date  . Cancer [facility])    Bladder  . Hiatal hernia   . High cholesterol   . HOH (hard of hearing)   . Hypertension   . Legally blind   . PE (pulmonary thromboembolism) ([place]) 09/2016  . Sinus drainage     Patient Active Problem List   Diagnosis Date Noted  . Cellulitis of leg, right 11/20/2016  . Anemia of chronic disease 11/20/2016  . GERD (gastroesophageal reflux disease) 11/20/2016  . Diastolic CHF, acute on chronic (HCC) 11/19/2016  . Failure to thrive in adult 11/19/2016  . Pulmonary embolism ([place]) 09/27/2016  . HCAP (healthcare-associated pneumonia) 09/27/2016  . Proximal humeral fracture 08/16/2016  . Essential hypertension 08/16/2016  . Bradycardia 08/16/2016  . Pressure injury of skin 08/16/2016  . Closed torus fracture of upper end of right humerus     Past Surgical History:  Procedure Laterality Date  . BLADDER REMOVAL  Nov. 1993   part  . DILATION AND CURETTAGE OF UTERUS     50+ years    OB History    No data available       Home Medications    Prior to Admission medications   Medication Sig Start Date End Date Taking? Authorizing Provider  acetaminophen (TYLENOL) 325 MG tablet Take 2 tablets (650 mg total) by mouth every 6 (six) hours as needed for mild pain (or Fever >/= 101). 09/30/16   Robbie Lis, MD  acidophilus (RISAQUAD) CAPS capsule Take 2 capsules by mouth 3  (three) times daily. 11/24/16   Donne Hazel, MD  alendronate (FOSAMAX) 70 MG tablet Take 70 mg by mouth every Tuesday. Take usually on Mondays or Tuesdays 07/25/16   [provider]  apixaban (ELIQUIS) 5 MG TABS tablet Take 1 tablet (5 mg total) by mouth 2 (two) times daily. Patient taking differently: Take 2.5 mg by mouth 2 (two) times daily.  10/07/16   Robbie Lis, MD  Calcium Carbonate (CALCIUM 600 PO) Take 1 tablet by mouth daily.     [provider]  Cholecalciferol (VITAMIN D-3) 1000 units CAPS Take 1 capsule by mouth daily.    [provider]  clindamycin (CLEOCIN) 150 MG capsule Take 1 capsule (150 mg total) by mouth every 6 (six) hours. 11/24/16   Donne Hazel, MD  docusate sodium (COLACE) 100 MG capsule Take 100 mg by mouth daily as needed for mild constipation.    [provider]  furosemide (LASIX) 20 MG tablet Take 1 tablet (20 mg total) by mouth daily. 12/14/16   Drenda Freeze, MD  furosemide (LASIX) 40 MG tablet Take 1 tablet (40 mg total) by mouth daily. 11/24/16   Donne Hazel, MD  HYDROcodone-acetaminophen (NORCO/VICODIN) 5-325 MG tablet Take 0.5 tablets by mouth at bedtime as needed for pain. 11/10/16   [provider]  HYDROcodone-acetaminophen (NORCO/VICODIN) 5-325  MG tablet Take 0.5 tablets by mouth every 4 (four) hours as needed for moderate pain. 11/24/16   Donne Hazel, MD  naphazoline-pheniramine (NAPHCON-A) 0.025-0.3 % ophthalmic solution Place 1 drop into both eyes 4 (four) times daily as needed for irritation or allergies.     [provider]  senna (SENOKOT) 8.6 MG TABS tablet Take 1 tablet (8.6 mg total) by mouth at bedtime. 09/30/16   Robbie Lis, MD  sucralfate (CARAFATE) 1 g tablet Take 1 g by mouth 2 (two) times daily as needed (stomach).  11/10/16   [provider]  triamcinolone cream (KENALOG) 0.1 % Apply 1 application topically daily as needed (rash). TO LEGS 07/28/16   [provider]    Family History Family History  Problem Relation Age of Onset  . Cancer Father   . Cancer Sister   . Cancer Brother     Social History Social History  Substance Use Topics  . Smoking status: Former Research scientist (life sciences)  . Smokeless tobacco: Former Systems developer    Types: Snuff  . Alcohol use No     Allergies   Tape; Keflet [cephalexin]; and Sulfa antibiotics   Review of Systems Review of Systems  All other systems reviewed and are negative.    Physical Exam Updated Vital Signs BP 127/62 (BP Location: Left Arm)   Pulse 67   Temp 98.4 F (36.9 C) (Oral)   Resp 15   SpO2 95%   Physical Exam  Constitutional: She is oriented to person, place, and time. She appears well-developed and well-nourished.  Non-toxic appearance. No distress.  HENT:  Head: Normocephalic and atraumatic.  Eyes: Conjunctivae, EOM and lids are normal. Pupils are equal, round, and reactive to light.  Neck: Normal range of motion. Neck supple. No tracheal deviation present. No thyroid mass present.  Cardiovascular: Normal rate, regular rhythm and normal heart sounds.  Exam reveals no gallop.   No murmur heard. Pulmonary/Chest: Effort normal and breath sounds normal. No stridor. No respiratory distress. She has no decreased breath sounds. She has no wheezes. She has no rhonchi. She has no rales.  Abdominal: Soft. Normal appearance and bowel sounds are normal. She exhibits no distension. There is no tenderness. There is no rebound and no CVA tenderness.  Musculoskeletal: Normal range of motion. She exhibits no edema or tenderness.  Bilateral le edema with some clear blisters NVT at both feet  Neurological: She is alert and oriented to person, place, and time. She has normal strength. No cranial nerve deficit or sensory deficit. GCS eye subscore is 4. GCS verbal subscore is 5. GCS motor subscore is 6.  Skin: Skin is warm and dry. No abrasion and no rash noted.  Psychiatric: She has a normal mood and affect.  Her speech is normal and behavior is normal.  Nursing note and vitals reviewed.    ED Treatments / Results  Labs (all labs ordered are listed, but only abnormal results are displayed) Labs Reviewed  CBC WITH DIFFERENTIAL/PLATELET  BASIC METABOLIC PANEL    EKG  EKG Interpretation None       Radiology No results found.  Procedures Procedures (including critical care time)  Medications Ordered in ED Medications - No data to display   Initial Impression / Assessment and Plan / ED Course  I have reviewed the triage vital signs and the nursing notes.  Pertinent labs & imaging results that were available during my care of the patient were reviewed by me and considered in my medical  decision making (see chart for details).     Patient given oral potassium here. Patient's urinalysis without signs of infection. Patient unable to ambulate here and family states that they cannot take care of her at home. They did call her home health agency who stated that they do not have the availability to offer 24-hour care. Social worker consult for placement  Final Clinical Impressions(s) / ED Diagnoses   Final diagnoses:  None    New Prescriptions New Prescriptions   No medications on file     Lacretia Leigh, MD 01/21/17 1520

## 2017-01-21 NOTE — Progress Notes (Signed)
CSW received a call from [place] at [facility] Pt has been accepted by: [facility] Number for report is: [phone] Pt's room/bed number will be: 405 A Pt's unit will be: SNF Accepting physician: SNF MD  Pt can arrive ASAP on 01/21/17   CSW will update RN and EDP.  Alphonse Guild. Sui Kasparek, Latanya Presser, LCAS Clinical Social Worker Ph: [phone]

## 2017-01-21 NOTE — ED Triage Notes (Signed)
Per EMS:  Pt C/O left leg pain and edema and reduced mobility. Pt is legally blind and is hard of hearing. Pt has baseline lateral swelling and some weeping in the lower extremities.   EMS vitals  128/78 72 16 94% on RA  CBG 111

## 2017-01-21 NOTE — Clinical Social Work Placement (Signed)
   CLINICAL SOCIAL WORK PLACEMENT  NOTE  Date:  01/21/2017  Patient Details  Name: Savannah Parks MRN: [id number] Date of Birth: December 17, 1910  Clinical Social Work is seeking post-discharge placement for this patient at the [facility] level of care (*CSW will initial, date and re-position this form in  chart as items are completed):  Yes   Patient/family provided with [facility] Work Department's list of facilities offering this level of care within the geographic area requested by the patient (or if unable, by the patient's family).  Yes   Patient/family informed of their freedom to choose among providers that offer the needed level of care, that participate in Medicare, Medicaid or managed care program needed by the patient, have an available bed and are willing to accept the patient.  Yes   Patient/family informed of [facility]'s ownership interest in [facility] and [facility], as well as of the fact that they are under no obligation to receive care at these facilities.  PASRR submitted to EDS on       PASRR number received on       Existing PASRR number confirmed on 01/21/17     FL2 transmitted to all facilities in geographic area requested by pt/family on    01/21/17   FL2 transmitted to all facilities within larger geographic area on 01/21/17     Patient informed that his/her managed care company has contracts with or will negotiate with certain facilities, including the following:  [facility], [facility]     Yes   Patient/family informed of bed offers received.  [name], [name]     Physician recommends and patient chooses bed at      Patient to be transferred to [facility], [place] on 01/21/17.  Patient to be transferred to facility by  Corey Harold)     Patient family notified on 01/21/17 of transfer.  Name of family member notified:  Adah Salvage     PHYSICIAN       Additional Comment:     _______________________________________________ Claudine Mouton, [place] 01/21/2017, 5:11 PM

## 2017-01-21 NOTE — Progress Notes (Signed)
**  Preliminary report by tech**  Left lower extremity venous duplex complete.  There is no obvious evidence of deep or superficial vein thrombosis involving the left lower extremity. All clearly visualized vessels appear patent and compressible. There is no evidence of a Baker's cyst on the left. Results were given to Dr. Zenia Resides.  01/21/17 1:38 PM Savannah Parks RVT

## 2017-01-21 NOTE — ED Provider Notes (Signed)
I assumed care of this patient from Dr. Zenia Resides at 1600.  Please see their note for further details of Hx, PE.  Briefly patient is a 83 y.o. female who presents with left leg pain and inability to ambulate. Workup was grossly unremarkable other than mild hypokalemia for which the patient was repleted. Given her inability to ambulate, social worker was contacted to assist with placement.  Social worker was able to find placement for the patient. Patient was discharged to facility.  The patient is safe for discharge with strict return precautions.  Disposition: Discharge  Condition: Good      Discharge Medication List as of 01/21/2017  5:59 PM    START taking these medications   Details  potassium chloride (K-DUR) 10 MEQ tablet Take 2 tablets (20 mEq total) by mouth daily., Starting Wed 01/21/2017, Until Wed 01/28/2017, Print        Follow Up: Hayden Rasmussen, MD [place] [address] [phone]  Schedule an appointment as soon as possible for a visit  As needed        Johnnell Liou, Grayce Sessions, MD 01/22/17 [phone]

## 2017-01-21 NOTE — ED Notes (Signed)
Report given to [place] at [facility].

## 2017-01-21 NOTE — ED Notes (Signed)
Bed: KG25 Expected date:  Expected time:  Means of arrival:  Comments: EMS/83 y.o. L.e. edema

## 2017-01-21 NOTE — Progress Notes (Signed)
[facility] has been called and [facility] are expecting pt soon.  Please reconsult if future social work needs arise.  CSW signing off.  Alphonse Guild. Devun Anna, Latanya Presser, LCAS Clinical Social Worker Ph: [phone]

## 2017-01-23 LAB — URINE CULTURE: Culture: 10000 — AB

## 2017-01-24 ENCOUNTER — Telehealth: Payer: Self-pay

## 2017-01-24 NOTE — Telephone Encounter (Signed)
No treatment needed for [facility] ED 01/21/17 per Durene Romans D

## 2017-03-15 ENCOUNTER — Emergency Department (HOSPITAL_COMMUNITY)
Admission: EM | Admit: 2017-03-15 | Discharge: 2017-03-15 | Disposition: A | Discharge status: Home / Self Care | Payer: Medicare Other | Attending: Emergency Medicine | Admitting: Emergency Medicine

## 2017-03-15 ENCOUNTER — Encounter (HOSPITAL_COMMUNITY): Payer: Self-pay | Admitting: Emergency Medicine

## 2017-03-15 ENCOUNTER — Emergency Department (HOSPITAL_COMMUNITY): Payer: Medicare Other

## 2017-03-15 DIAGNOSIS — Z87891 Personal history of nicotine dependence: Secondary | ICD-10-CM | POA: Insufficient documentation

## 2017-03-15 DIAGNOSIS — S0003XA Contusion of scalp, initial encounter: Secondary | ICD-10-CM | POA: Diagnosis not present

## 2017-03-15 DIAGNOSIS — Y999 Unspecified external cause status: Secondary | ICD-10-CM | POA: Insufficient documentation

## 2017-03-15 DIAGNOSIS — Z79899 Other long term (current) drug therapy: Secondary | ICD-10-CM | POA: Insufficient documentation

## 2017-03-15 DIAGNOSIS — D649 Anemia, unspecified: Secondary | ICD-10-CM | POA: Diagnosis not present

## 2017-03-15 DIAGNOSIS — I509 Heart failure, unspecified: Secondary | ICD-10-CM | POA: Insufficient documentation

## 2017-03-15 DIAGNOSIS — W19XXXA Unspecified fall, initial encounter: Secondary | ICD-10-CM

## 2017-03-15 DIAGNOSIS — Y9301 Activity, walking, marching and hiking: Secondary | ICD-10-CM | POA: Insufficient documentation

## 2017-03-15 DIAGNOSIS — I878 Other specified disorders of veins: Secondary | ICD-10-CM | POA: Diagnosis not present

## 2017-03-15 DIAGNOSIS — S0990XA Unspecified injury of head, initial encounter: Secondary | ICD-10-CM | POA: Diagnosis present

## 2017-03-15 DIAGNOSIS — W01198A Fall on same level from slipping, tripping and stumbling with subsequent striking against other object, initial encounter: Secondary | ICD-10-CM | POA: Diagnosis not present

## 2017-03-15 DIAGNOSIS — Y929 Unspecified place or not applicable: Secondary | ICD-10-CM | POA: Diagnosis not present

## 2017-03-15 DIAGNOSIS — I10 Essential (primary) hypertension: Secondary | ICD-10-CM | POA: Diagnosis not present

## 2017-03-15 NOTE — ED Triage Notes (Signed)
Pt here from home where she fell using her walker trying to reach for something , no loc , pt  Does have a hematoma to the back of her head and is on blood thinners

## 2017-03-15 NOTE — ED Notes (Signed)
Patient transported to CT 

## 2017-03-15 NOTE — ED Triage Notes (Signed)
Pt reports the skin redness below knees bilateral has been on going.

## 2017-03-15 NOTE — ED Provider Notes (Signed)
[place] DEPT Provider Note   CSN: [id number] Arrival date & time: 03/15/17  1343     History   Chief Complaint Chief Complaint  Patient presents with  . Fall    HPI Savannah Parks is a 83 y.o. female.  HPI   Mechanical fall while bending down to pick up kleenex. No LOC. Occurred around 12:30PM.  Is taking eliquis. Has pain only in area of  Reports soreness to the back of her head, Reports a sore spot on the right side. Denies any significant headache, denies neck pain, chest pain, belly pain, increased shortness of breath. Family reports she has chronic swelling of her bilateral lower extremities. They report they don't use compression hose as they are difficult to place. No other acute changes. Patient is hard of hearing.   Past Medical History:  Diagnosis Date  . Cancer [facility])    Bladder  . Hiatal hernia   . High cholesterol   . HOH (hard of hearing)   . Hypertension   . Legally blind   . PE (pulmonary thromboembolism) ([place]) 09/2016  . Sinus drainage     Patient Active Problem List   Diagnosis Date Noted  . Cellulitis of leg, right 11/20/2016  . Anemia of chronic disease 11/20/2016  . GERD (gastroesophageal reflux disease) 11/20/2016  . Diastolic CHF, acute on chronic (HCC) 11/19/2016  . Failure to thrive in adult 11/19/2016  . Pulmonary embolism ([place]) 09/27/2016  . HCAP (healthcare-associated pneumonia) 09/27/2016  . Proximal humeral fracture 08/16/2016  . Essential hypertension 08/16/2016  . Bradycardia 08/16/2016  . Pressure injury of skin 08/16/2016  . Closed torus fracture of upper end of right humerus     Past Surgical History:  Procedure Laterality Date  . BLADDER REMOVAL  Nov. 1993   part  . DILATION AND CURETTAGE OF UTERUS     50+ years    OB History    No data available       Home Medications    Prior to Admission medications   Medication Sig Start Date End Date Taking? Authorizing Provider  acetaminophen (TYLENOL) 325 MG tablet  Take 2 tablets (650 mg total) by mouth every 6 (six) hours as needed for mild pain (or Fever >/= 101). Patient taking differently: Take 325-650 mg by mouth every 6 (six) hours as needed for mild pain (or Fever >/= 101).  09/30/16  Yes Robbie Lis, MD  alendronate (FOSAMAX) 70 MG tablet Take 70 mg by mouth every Tuesday.  07/25/16  Yes [provider]  apixaban (ELIQUIS) 5 MG TABS tablet Take 1 tablet (5 mg total) by mouth 2 (two) times daily. Patient taking differently: Take 2.5 mg by mouth 2 (two) times daily.  10/07/16  Yes Robbie Lis, MD  Calcium Carbonate (CALCIUM 600 PO) Take 600 mg by mouth daily.    Yes [provider]  Cholecalciferol (VITAMIN D-3) 1000 units CAPS Take 1,000 Units by mouth daily.    Yes [provider]  docusate sodium (COLACE) 100 MG capsule Take 100 mg by mouth daily as needed for mild constipation.   Yes [provider]  furosemide (LASIX) 80 MG tablet Take 80 mg by mouth daily.   Yes [provider]  Glycerin-Hypromellose-PEG 400 (VISINE TEARS) 0.2-0.2-1 % SOLN Place 1 drop into both eyes at bedtime.   Yes [provider]  HYDROcodone-acetaminophen (NORCO/VICODIN) 5-325 MG tablet Take 0.5 tablets by mouth every 4 (four) hours as needed for moderate pain. 11/24/16  Yes Wyline Copas,  Orpah Melter, MD  naphazoline-pheniramine (NAPHCON-A) 0.025-0.3 % ophthalmic solution Place 1 drop into both eyes 4 (four) times daily as needed for irritation or allergies.    Yes [provider]  Omega-3 Fatty Acids (FISH OIL) 1000 MG CAPS Take 1,000 mg by mouth daily.   Yes [provider]  OVER THE COUNTER MEDICATION Take 1 tablet by mouth daily. Arti-Clear supplement   Yes [provider]  QUEtiapine (SEROQUEL) 25 MG tablet Take 25 mg by mouth daily. 03/13/17  Yes [provider]  senna (SENOKOT) 8.6 MG TABS tablet Take 1 tablet (8.6 mg total) by mouth at bedtime. 09/30/16  Yes Robbie Lis, MD  furosemide  (LASIX) 20 MG tablet Take 1 tablet (20 mg total) by mouth daily. Patient not taking: Reported on 03/15/2017 12/14/16   Drenda Freeze, MD  furosemide (LASIX) 40 MG tablet Take 1 tablet (40 mg total) by mouth daily. Patient not taking: Reported on 03/15/2017 11/24/16   Donne Hazel, MD    Family History Family History  Problem Relation Age of Onset  . Cancer Father   . Cancer Sister   . Cancer Brother     Social History Social History  Substance Use Topics  . Smoking status: Former Research scientist (life sciences)  . Smokeless tobacco: Former Systems developer    Types: Snuff  . Alcohol use No     Allergies   Tape; Keflet [cephalexin]; and Sulfa antibiotics   Review of Systems Review of Systems  Constitutional: Negative for fever.  HENT: Negative for sore throat.   Eyes: Negative for visual disturbance.  Respiratory: Negative for cough and shortness of breath.   Cardiovascular: Positive for leg swelling. Negative for chest pain.  Gastrointestinal: Negative for abdominal pain, nausea and vomiting.  Genitourinary: Negative for difficulty urinating.  Musculoskeletal: Negative for back pain and neck pain.  Skin: Negative for rash.  Neurological: Positive for headaches. Negative for syncope.     Physical Exam Updated Vital Signs BP (!) 177/86 (BP Location: Right Arm)   Pulse 82   Temp 97.7 F (36.5 C) (Oral)   Resp 16   SpO2 97%   Physical Exam  Constitutional: She is oriented to person, place, and time. She appears well-developed and well-nourished. No distress.  HENT:  Head: Normocephalic.  Hematoma right posterior parietal area  Eyes: Conjunctivae and EOM are normal.  Neck: Normal range of motion.  Cardiovascular: Normal rate, regular rhythm, normal heart sounds and intact distal pulses.  Exam reveals no gallop and no friction rub.   No murmur heard. Pulmonary/Chest: Effort normal and breath sounds normal. No respiratory distress. She has no wheezes. She has no rales.  Abdominal: Soft. She  exhibits no distension. There is no tenderness. There is no guarding.  Musculoskeletal: She exhibits edema (2+ bilateral, chronic, erythema bilateral). She exhibits no tenderness.  Neurological: She is alert and oriented to person, place, and time.  Skin: Skin is warm and dry. No rash noted. She is not diaphoretic. No erythema.  Nursing note and vitals reviewed.    ED Treatments / Results  Labs (all labs ordered are listed, but only abnormal results are displayed) Labs Reviewed - No data to display  EKG  EKG Interpretation None       Radiology Ct Head Wo Contrast  Result Date: 03/15/2017 CLINICAL DATA:  Fall with trauma to back of head. EXAM: CT HEAD WITHOUT CONTRAST TECHNIQUE: Contiguous axial images were obtained from the base of the skull through the vertex without intravenous contrast.  COMPARISON:  11/19/2016 FINDINGS: Brain: Expected cerebral volume loss for age. Moderate low density in the periventricular white matter likely related to small vessel disease. Cerebellar atrophy is also age appropriate. Artifact versus remote lacunar infarct in the pons on image 11/series 3. No mass lesion, hemorrhage, hydrocephalus, acute infarct, intra-axial, or extra-axial fluid collection. Vascular: Intracranial atherosclerosis. Skull: Posterior right scalp soft tissue swelling is relatively mild on image 26/series 4. No skull fracture. Sinuses/Orbits: Bilateral lens extractions. Clear paranasal sinuses and mastoid air cells. Other: None. IMPRESSION: 1. Posterior right scalp soft tissue swelling, without acute intracranial abnormality. 2.  Cerebral atrophy and small vessel ischemic change. Electronically Signed   By: Abigail Miyamoto M.D.   On: 03/15/2017 16:58    Procedures Procedures (including critical care time)  Medications Ordered in ED Medications - No data to display   Initial Impression / Assessment and Plan / ED Course  I have reviewed the triage vital signs and the nursing  notes.  Pertinent labs & imaging results that were available during my care of the patient were reviewed by me and considered in my medical decision making (see chart for details).     83yo female presents with concern for fall while trying to bend down and pick up a tissue. Mechanical fall by history, no LOC, no other acute medical concerns. No neck pain, no midline tenderness, no neuro deficits. CT head WNL. Discussed return precautions. Patient discharged in stable condition with understanding of reasons to return.   Final Clinical Impressions(s) / ED Diagnoses   Final diagnoses:  Fall, initial encounter  Contusion of scalp, initial encounter  Venous stasis    New Prescriptions Discharge Medication List as of 03/15/2017  5:16 PM       Gareth Morgan, MD 03/15/17 8546

## 2017-04-25 ENCOUNTER — Emergency Department (HOSPITAL_COMMUNITY): Payer: Medicare Other

## 2017-04-25 ENCOUNTER — Observation Stay (HOSPITAL_COMMUNITY)
Admission: EM | Admit: 2017-04-25 | Discharge: 2017-04-27 | Disposition: A | Discharge status: Home / Self Care | Payer: Medicare Other | Attending: Family Medicine | Admitting: Family Medicine

## 2017-04-25 ENCOUNTER — Encounter (HOSPITAL_COMMUNITY): Payer: Self-pay

## 2017-04-25 DIAGNOSIS — H548 Legal blindness, as defined in USA: Secondary | ICD-10-CM | POA: Insufficient documentation

## 2017-04-25 DIAGNOSIS — R140 Abdominal distension (gaseous): Secondary | ICD-10-CM | POA: Insufficient documentation

## 2017-04-25 DIAGNOSIS — E7800 Pure hypercholesterolemia, unspecified: Secondary | ICD-10-CM | POA: Insufficient documentation

## 2017-04-25 DIAGNOSIS — Z881 Allergy status to other antibiotic agents status: Secondary | ICD-10-CM | POA: Insufficient documentation

## 2017-04-25 DIAGNOSIS — K219 Gastro-esophageal reflux disease without esophagitis: Secondary | ICD-10-CM | POA: Insufficient documentation

## 2017-04-25 DIAGNOSIS — R05 Cough: Secondary | ICD-10-CM | POA: Diagnosis not present

## 2017-04-25 DIAGNOSIS — R0602 Shortness of breath: Secondary | ICD-10-CM | POA: Diagnosis not present

## 2017-04-25 DIAGNOSIS — Z86711 Personal history of pulmonary embolism: Secondary | ICD-10-CM | POA: Diagnosis not present

## 2017-04-25 DIAGNOSIS — Z79899 Other long term (current) drug therapy: Secondary | ICD-10-CM | POA: Insufficient documentation

## 2017-04-25 DIAGNOSIS — R2243 Localized swelling, mass and lump, lower limb, bilateral: Secondary | ICD-10-CM | POA: Insufficient documentation

## 2017-04-25 DIAGNOSIS — D638 Anemia in other chronic diseases classified elsewhere: Secondary | ICD-10-CM | POA: Diagnosis not present

## 2017-04-25 DIAGNOSIS — Z7901 Long term (current) use of anticoagulants: Secondary | ICD-10-CM | POA: Diagnosis not present

## 2017-04-25 DIAGNOSIS — I110 Hypertensive heart disease with heart failure: Secondary | ICD-10-CM | POA: Insufficient documentation

## 2017-04-25 DIAGNOSIS — Z8551 Personal history of malignant neoplasm of bladder: Secondary | ICD-10-CM | POA: Diagnosis not present

## 2017-04-25 DIAGNOSIS — I509 Heart failure, unspecified: Secondary | ICD-10-CM

## 2017-04-25 DIAGNOSIS — Z882 Allergy status to sulfonamides status: Secondary | ICD-10-CM | POA: Diagnosis not present

## 2017-04-25 DIAGNOSIS — R531 Weakness: Principal | ICD-10-CM | POA: Insufficient documentation

## 2017-04-25 DIAGNOSIS — J189 Pneumonia, unspecified organism: Secondary | ICD-10-CM

## 2017-04-25 DIAGNOSIS — I5033 Acute on chronic diastolic (congestive) heart failure: Secondary | ICD-10-CM | POA: Insufficient documentation

## 2017-04-25 DIAGNOSIS — Z791 Long term (current) use of non-steroidal anti-inflammatories (NSAID): Secondary | ICD-10-CM | POA: Diagnosis not present

## 2017-04-25 DIAGNOSIS — Z87891 Personal history of nicotine dependence: Secondary | ICD-10-CM | POA: Diagnosis not present

## 2017-04-25 DIAGNOSIS — Z79891 Long term (current) use of opiate analgesic: Secondary | ICD-10-CM | POA: Diagnosis not present

## 2017-04-25 DIAGNOSIS — E46 Unspecified protein-calorie malnutrition: Secondary | ICD-10-CM | POA: Insufficient documentation

## 2017-04-25 DIAGNOSIS — R609 Edema, unspecified: Secondary | ICD-10-CM

## 2017-04-25 LAB — URINALYSIS, ROUTINE W REFLEX MICROSCOPIC
Bilirubin Urine: NEGATIVE
GLUCOSE, UA: NEGATIVE mg/dL
Hgb urine dipstick: NEGATIVE
KETONES UR: NEGATIVE mg/dL
LEUKOCYTES UA: NEGATIVE
Nitrite: NEGATIVE
PH: 8 (ref 5.0–8.0)
Protein, ur: NEGATIVE mg/dL
SPECIFIC GRAVITY, URINE: 1.005 (ref 1.005–1.030)

## 2017-04-25 LAB — CBC WITH DIFFERENTIAL/PLATELET
BASOS ABS: 0 10*3/uL (ref 0.0–0.1)
Basophils Relative: 0 %
Eosinophils Absolute: 0.2 10*3/uL (ref 0.0–0.7)
Eosinophils Relative: 2 %
HEMATOCRIT: 40.8 % (ref 36.0–46.0)
HEMOGLOBIN: 12.9 g/dL (ref 12.0–15.0)
LYMPHS PCT: 31 %
Lymphs Abs: 2.5 10*3/uL (ref 0.7–4.0)
MCH: 27.3 pg (ref 26.0–34.0)
MCHC: 31.6 g/dL (ref 30.0–36.0)
MCV: 86.4 fL (ref 78.0–100.0)
MONO ABS: 0.9 10*3/uL (ref 0.1–1.0)
MONOS PCT: 11 %
NEUTROS ABS: 4.4 10*3/uL (ref 1.7–7.7)
Neutrophils Relative %: 56 %
Platelets: 233 10*3/uL (ref 150–400)
RBC: 4.72 MIL/uL (ref 3.87–5.11)
RDW: 15.9 % — AB (ref 11.5–15.5)
WBC: 8 10*3/uL (ref 4.0–10.5)

## 2017-04-25 LAB — COMPREHENSIVE METABOLIC PANEL
ALT: 8 U/L — ABNORMAL LOW (ref 14–54)
ANION GAP: 10 (ref 5–15)
AST: 19 U/L (ref 15–41)
Albumin: 3 g/dL — ABNORMAL LOW (ref 3.5–5.0)
Alkaline Phosphatase: 54 U/L (ref 38–126)
BILIRUBIN TOTAL: 0.3 mg/dL (ref 0.3–1.2)
BUN: 10 mg/dL (ref 6–20)
CO2: 31 mmol/L (ref 22–32)
Calcium: 9.1 mg/dL (ref 8.9–10.3)
Chloride: 97 mmol/L — ABNORMAL LOW (ref 101–111)
Creatinine, Ser: 1.19 mg/dL — ABNORMAL HIGH (ref 0.44–1.00)
GFR calc non Af Amer: 35 mL/min — ABNORMAL LOW (ref >60)
GFR, EST AFRICAN AMERICAN: 41 mL/min — AB (ref >60)
GLUCOSE: 117 mg/dL — AB (ref 65–99)
POTASSIUM: 3.6 mmol/L (ref 3.5–5.1)
Sodium: 138 mmol/L (ref 135–145)
TOTAL PROTEIN: 5.9 g/dL — AB (ref 6.5–8.1)

## 2017-04-25 LAB — TROPONIN I: Troponin I: <0.03 ng/mL (ref <0.03)

## 2017-04-25 LAB — BRAIN NATRIURETIC PEPTIDE: B NATRIURETIC PEPTIDE 5: 148.7 pg/mL — AB (ref 0.0–100.0)

## 2017-04-25 MED ORDER — ALENDRONATE SODIUM 70 MG PO TABS: 70.0000 mg | ORAL_TABLET | ORAL | Status: DC

## 2017-04-25 MED ORDER — QUETIAPINE FUMARATE 25 MG PO TABS
25.0000 mg | ORAL_TABLET | Freq: Every day | ORAL | Status: DC
  Administered 2017-04-25 – 2017-04-27 (×3): 25 mg via ORAL
  Filled 2017-04-25 (×4): qty 1

## 2017-04-25 MED ORDER — DOCUSATE SODIUM 100 MG PO CAPS: 100.0000 mg | ORAL_CAPSULE | Freq: Every day | ORAL | Status: DC | PRN

## 2017-04-25 MED ORDER — ENSURE ENLIVE PO LIQD
237.0000 mL | Freq: Two times a day (BID) | ORAL | Status: DC
  Administered 2017-04-26 – 2017-04-27 (×3): 237 mL via ORAL

## 2017-04-25 MED ORDER — ACETAMINOPHEN 325 MG PO TABS: 650.0000 mg | ORAL_TABLET | Freq: Four times a day (QID) | ORAL | Status: DC | PRN

## 2017-04-25 MED ORDER — SENNA 8.6 MG PO TABS
1.0000 | ORAL_TABLET | Freq: Every day | ORAL | Status: DC
  Administered 2017-04-25 – 2017-04-26 (×2): 8.6 mg via ORAL
  Filled 2017-04-25 (×2): qty 1

## 2017-04-25 MED ORDER — LEVOFLOXACIN 500 MG PO TABS
250.0000 mg | ORAL_TABLET | Freq: Every day | ORAL | Status: AC
  Administered 2017-04-25 – 2017-04-27 (×3): 250 mg via ORAL
  Filled 2017-04-25 (×3): qty 1

## 2017-04-25 MED ORDER — FUROSEMIDE 10 MG/ML IJ SOLN
40.0000 mg | Freq: Two times a day (BID) | INTRAMUSCULAR | Status: DC
  Administered 2017-04-25 – 2017-04-26 (×2): 40 mg via INTRAVENOUS
  Filled 2017-04-25 (×2): qty 4

## 2017-04-25 MED ORDER — ACETAMINOPHEN 650 MG RE SUPP: 650.0000 mg | Freq: Four times a day (QID) | RECTAL | Status: DC | PRN

## 2017-04-25 MED ORDER — APIXABAN 2.5 MG PO TABS
2.5000 mg | ORAL_TABLET | Freq: Two times a day (BID) | ORAL | Status: DC
  Administered 2017-04-25 – 2017-04-27 (×4): 2.5 mg via ORAL
  Filled 2017-04-25 (×4): qty 1

## 2017-04-25 NOTE — ED Triage Notes (Signed)
To hallway via EMS.  Pt dx w/pneumonia 04-23-17 on antibiotics and neb treatments-last tx 11am.  Cough is congested.  Pt here today for generalized swelling.  No respiratory or swallowing difficulties.

## 2017-04-25 NOTE — ED Provider Notes (Signed)
[place] DEPT Provider Note   CSN: [id number] Arrival date & time: 04/25/17  1228     History   Chief Complaint Chief Complaint  Patient presents with  . Leg Swelling    HPI Savannah Parks is a 83 y.o. female.  HPI Patient presents with swelling in her legs. 2 days ago was started on an unknown antibiotic for pneumonia. Maybe Levaquin since it was taken once a day. Since then her legs got more swollen she's gotten more generally weak. Unable to get off the toilet earlier today. No chest pain. History of heart failure. Has had a cough with some mild sputum production. Hasn't had clear fevers.Had x-ray done by primary care doctor that reportedly showed pneumonia. Increasing weakness since.   Past Medical History:  Diagnosis Date  . Cancer [facility])    Bladder  . Hiatal hernia   . High cholesterol   . HOH (hard of hearing)   . Hypertension   . Legally blind   . PE (pulmonary thromboembolism) ([place]) 09/2016  . Sinus drainage     Patient Active Problem List   Diagnosis Date Noted  . Cellulitis of leg, right 11/20/2016  . Anemia of chronic disease 11/20/2016  . GERD (gastroesophageal reflux disease) 11/20/2016  . Diastolic CHF, acute on chronic (HCC) 11/19/2016  . Failure to thrive in adult 11/19/2016  . Pulmonary embolism ([place]) 09/27/2016  . HCAP (healthcare-associated pneumonia) 09/27/2016  . Proximal humeral fracture 08/16/2016  . Essential hypertension 08/16/2016  . Bradycardia 08/16/2016  . Pressure injury of skin 08/16/2016  . Closed torus fracture of upper end of right humerus     Past Surgical History:  Procedure Laterality Date  . BLADDER REMOVAL  Nov. 1993   part  . DILATION AND CURETTAGE OF UTERUS     50+ years    OB History    No data available       Home Medications    Prior to Admission medications   Medication Sig Start Date End Date Taking? Authorizing Provider  acetaminophen (TYLENOL) 325 MG tablet Take 2 tablets (650 mg total) by  mouth every 6 (six) hours as needed for mild pain (or Fever >/= 101). Patient taking differently: Take 325-650 mg by mouth every 6 (six) hours as needed for mild pain (or Fever >/= 101).  09/30/16   Robbie Lis, MD  alendronate (FOSAMAX) 70 MG tablet Take 70 mg by mouth every Tuesday.  07/25/16   [provider]  apixaban (ELIQUIS) 5 MG TABS tablet Take 1 tablet (5 mg total) by mouth 2 (two) times daily. Patient taking differently: Take 2.5 mg by mouth 2 (two) times daily.  10/07/16   Robbie Lis, MD  Calcium Carbonate (CALCIUM 600 PO) Take 600 mg by mouth daily.     [provider]  Cholecalciferol (VITAMIN D-3) 1000 units CAPS Take 1,000 Units by mouth daily.     [provider]  docusate sodium (COLACE) 100 MG capsule Take 100 mg by mouth daily as needed for mild constipation.    [provider]  furosemide (LASIX) 20 MG tablet Take 1 tablet (20 mg total) by mouth daily. Patient not taking: Reported on 03/15/2017 12/14/16   Drenda Freeze, MD  furosemide (LASIX) 40 MG tablet Take 1 tablet (40 mg total) by mouth daily. Patient not taking: Reported on 03/15/2017 11/24/16   Donne Hazel, MD  furosemide (LASIX) 80 MG tablet Take 80 mg by mouth daily.    [provider]  Glycerin-Hypromellose-PEG 400 (VISINE TEARS) 0.2-0.2-1 % SOLN Place 1 drop into both eyes at bedtime.    [provider]  HYDROcodone-acetaminophen (NORCO/VICODIN) 5-325 MG tablet Take 0.5 tablets by mouth every 4 (four) hours as needed for moderate pain. 11/24/16   Donne Hazel, MD  naphazoline-pheniramine (NAPHCON-A) 0.025-0.3 % ophthalmic solution Place 1 drop into both eyes 4 (four) times daily as needed for irritation or allergies.     [provider]  Omega-3 Fatty Acids (FISH OIL) 1000 MG CAPS Take 1,000 mg by mouth daily.    [provider]  OVER THE COUNTER MEDICATION Take 1 tablet by mouth daily. Arti-Clear supplement    [provider]    QUEtiapine (SEROQUEL) 25 MG tablet Take 25 mg by mouth daily. 03/13/17   [provider]  senna (SENOKOT) 8.6 MG TABS tablet Take 1 tablet (8.6 mg total) by mouth at bedtime. 09/30/16   Robbie Lis, MD    Family History Family History  Problem Relation Age of Onset  . Cancer Father   . Cancer Sister   . Cancer Brother     Social History Social History  Substance Use Topics  . Smoking status: Former Research scientist (life sciences)  . Smokeless tobacco: Former Systems developer    Types: Snuff  . Alcohol use No     Allergies   Tape; Keflet [cephalexin]; and Sulfa antibiotics   Review of Systems Review of Systems  Constitutional: Positive for appetite change.  HENT: Negative for congestion.   Respiratory: Positive for cough and shortness of breath.   Cardiovascular: Positive for leg swelling. Negative for chest pain.  Gastrointestinal: Negative for abdominal pain.  Genitourinary: Negative for flank pain.  Skin: Negative for rash.  Neurological: Negative for syncope.  Hematological: Negative for adenopathy.  Psychiatric/Behavioral: Negative for confusion.     Physical Exam Updated Vital Signs BP (!) 124/49   Pulse 70   Temp 98.1 F (36.7 C) (Oral)   Resp (!) 23   SpO2 100%   Physical Exam  Constitutional: She appears well-developed.  HENT:  Head: Normocephalic.  Neck: Neck supple.  Cardiovascular: Normal rate.   Pulmonary/Chest: Effort normal.  Mildly harsh breath sounds diffusely with mild rales at the bases.  Abdominal: Soft. There is no tenderness.  Musculoskeletal: She exhibits edema.  Moderate pitting edema bilateral lower extremities. Some chronic skin changes and there is some warmth on it.  Neurological: She is alert.  Skin: Skin is warm. Capillary refill takes less than 2 seconds.     ED Treatments / Results  Labs (all labs ordered are listed, but only abnormal results are displayed) Labs Reviewed  COMPREHENSIVE METABOLIC PANEL - Abnormal; Notable for the following:        Result Value   Chloride 97 (*)    Glucose, Bld 117 (*)    Creatinine, Ser 1.19 (*)    Total Protein 5.9 (*)    Albumin 3.0 (*)    ALT 8 (*)    GFR calc non Af Amer 35 (*)    GFR calc Af Amer 41 (*)    All other components within normal limits  BRAIN NATRIURETIC PEPTIDE - Abnormal; Notable for the following:    B Natriuretic Peptide 148.7 (*)    All other components within normal limits  CBC WITH DIFFERENTIAL/PLATELET - Abnormal; Notable for the following:    RDW 15.9 (*)    All other components within normal limits  URINALYSIS, ROUTINE W REFLEX MICROSCOPIC - Abnormal; Notable for the following:  Color, Urine COLORLESS (*)    All other components within normal limits  TROPONIN I    EKG  EKG Interpretation  Date/Time:  Saturday April 25 2017 12:46:51 EDT Ventricular Rate:  80 PR Interval:    QRS Duration: 134 QT Interval:  416 QTC Calculation: 480 R Axis:   88 Text Interpretation:  Sinus rhythm Right bundle branch block Reconfirmed by Davonna Belling [phone]) on 04/25/2017 2:16:00 PM       Radiology Dg Chest 2 View  Result Date: 04/25/2017 CLINICAL DATA:  Cough for a week. EXAM: CHEST  2 VIEW COMPARISON:  12/14/2016 FINDINGS: Low volume chest with small pleural effusions. There is pulmonary vascular congestion without generalized Kerley lines. No asymmetric opacity, air bronchogram, or pneumothorax. Normal heart size. Distorted proximal right humerus from remote fracture. IMPRESSION: Low volume chest with small effusions and vascular congestion. Electronically Signed   By: Monte Fantasia M.D.   On: 04/25/2017 13:42    Procedures Procedures (including critical care time)  Medications Ordered in ED Medications - No data to display   Initial Impression / Assessment and Plan / ED Course  I have reviewed the triage vital signs and the nursing notes.  Pertinent labs & imaging results that were available during my care of the patient were reviewed by me and  considered in my medical decision making (see chart for details).     Patient presents with shortness of breath and cough. Started on antibiotics by her primary care doctor. Since then has had worsening swelling in her legs. Also worsening generalized weakness. Unable to get up. Lab work here shows mildly elevated BNP. White count reassuring. X-ray shows vascular congestion. She does have moderate swelling of both of her legs.. With comorbidities and failure of outpatient management I think she would benefit from admission.  Final Clinical Impressions(s) / ED Diagnoses   Final diagnoses:  Peripheral edema  Community acquired pneumonia, unspecified laterality    New Prescriptions New Prescriptions   No medications on file     Davonna Belling, MD 04/25/17 1502

## 2017-04-25 NOTE — ED Notes (Signed)
Report attempted 

## 2017-04-25 NOTE — H&P (Signed)
[facility] Admission History and Physical Service Pager: [phone]  Patient name: Savannah Parks Medical record number: [id number] Date of birth: 1911-01-19 Age: 83 y.o. Gender: female  Primary Care Provider: Hayden Rasmussen, MD Consultants: none Code Status: DNR/DNI  Chief Complaint: generalized weakness and swelling  Assessment and Plan: Savannah Parks is a 83 y.o. female presenting with generalized weakness and swelling. PMH is significant for HFpEF, HTN, legally blind, HOH, h/o PE.   #Generalized weakness: Family notes recent decline and patient overall status with generalized weakness, worsening this past week. Patient diagnosed with PNA in clinic on Thursday, has been on Levaquin since. Question of possibility of underlying infection vs possibility of medication side effects vs CHF exacerbation. ACS unlikely with negative troponin and EKG without ST/T wave changes. - Admit to family medicine teaching service under inpatient status, attending Dr. Madison Parks - Monitor vitals routinely per floor - Regular diet with Ensure supplementation - PT/OT to evaluate  #Lower extremity edema:Been progressively worsening for a week. Patient reports that's why she is here, to get the fluid off. Initial BNP 148.7. Wt on admit: 152 lb. Does not weigh herself at home and family does not know her dry weight. Suspecting diastolic congestive heart failure exacerbation vs third spacing secondary to malnutrition or a combination of both. Last echocardiogram done on 09/2016 with EF 70% with grade 2 diastolic dysfunction. - Continuous pulse oximetry with nasal cannula oxygen as needed to keep O2 saturations >92% - Strict I&Os and daily weights - Elevate lower extremities - Lasix 40 mg IV bid- on 80 mg BID po at home - Reassess in a.m. to determine if further diuresis as needed  #Elevated creatinine: Cr 1.19 on admission, baseline 0.8. May be related to increased Lasix dose  this week (normally taking 80mg  daily, but was increased to 80mg  bid by PCP this week). Concern for third spacing and intravascular depletion. - Will see how creatinine responds to IV Lasix - If it increases again tomorrow, can always give fluids and monitor - Avoid nephrotoxic agents - Daily BMP  #Recent diagnosis of pneumonia:Patient had a CXR 2 days ago in the office and was started on Levaquin for PNA. CXR in ED was negative for PNA, but will continue Levaquin due to positive CXR in office. Unable to see that CXR. - Continue 5 day course of Levaquin.   #Protein-calorie malnutrition: Albumin 3.0, total protein 5.9. - Regular diet - Ensure bid  #History of pulmonary embolus: h/o PE 09/2016 following humeral fracture in 08/2016 - Patient is continued on Eliquis 2.5 mg BID, as tolerated - Remains stable at present  #Anemia of chronic disease: stable. Hgb 12.9 on admission - continue to monitor with CBC  #GERD - Patient to continue Carafate - Currently stable  FEN/GI: Regular diet Prophylaxis: Eliquis  Disposition: Admit to med-surg  History of Present Illness:  Savannah Parks is a 83 y.o. female presenting with generalized weakness and fluid overload with leg swelling. She has also had dry cough and sore throat for the last week. She was seen by her PCP two days ago and was diagnosed with a pneumonia. She was started on Levaquin. This morning, she was very nauseated and couldn't get up off the toilet because she was too weak and nauseated. They called her PCP, who recommended bringing patient to the ED. Her nausea has resolved. Her daughter and son noticed that her cough was more dry.   She has had worsening shortness of breath on exertion.  She endorses PND, but denies orthopnea. She sleeps with the head of her bed raised. She has had worsening lower extremity edema over the last 2-3 months. She was initially taking Lasix 80mg  daily, but then she was increased to 80mg  in the  morning and 40mg  in the evening, then to 80mg  bid this week. Daughter and son have noticed that the swelling in her hands has gotten better with the increased Lasix dose. No fevers. For the last week, she has been more confused, especially at night. She would think that it's morning time in the middle of the night.  Her daughter lives at home with her. At baseline, she uses a walker. She needs assistance with bathing and toileting. She has a physical therapist and a home health aide through [facility].  In the ED, vitals were within normal limits except RRs in the low 20s. Afebrile. Labs significant for Cr 1.19 (BL 0.8), albumin 3.0, BNP 148, trop <0.03, UA unremarkable. EKG without ST/T wave changes. CXR with small effusions and vascular congestion.  Review Of Systems: Per HPI with the following additions:   Review of Systems  Constitutional: Positive for malaise/fatigue. Negative for chills and fever.  HENT: Positive for congestion and sore throat.   Eyes: Negative for blurred vision and double vision.  Respiratory: Positive for cough and shortness of breath. Negative for sputum production.   Cardiovascular: Positive for leg swelling and PND. Negative for chest pain and orthopnea.  Gastrointestinal: Positive for nausea. Negative for vomiting.  Genitourinary: Negative for dysuria and urgency.  Musculoskeletal: Negative for joint pain and myalgias.  Neurological: Negative for dizziness and headaches.  Psychiatric/Behavioral: Negative for depression. The patient is not nervous/anxious.     Patient Active Problem List   Diagnosis Date Noted  . Cellulitis of leg, right 11/20/2016  . Anemia of chronic disease 11/20/2016  . GERD (gastroesophageal reflux disease) 11/20/2016  . Diastolic CHF, acute on chronic (HCC) 11/19/2016  . Failure to thrive in adult 11/19/2016  . Pulmonary embolism ([place]) 09/27/2016  . HCAP (healthcare-associated pneumonia) 09/27/2016  . Proximal humeral fracture  08/16/2016  . Essential hypertension 08/16/2016  . Bradycardia 08/16/2016  . Pressure injury of skin 08/16/2016  . Closed torus fracture of upper end of right humerus     Past Medical History: Past Medical History:  Diagnosis Date  . Cancer [facility])    Bladder  . Hiatal hernia   . High cholesterol   . HOH (hard of hearing)   . Hypertension   . Legally blind   . PE (pulmonary thromboembolism) ([place]) 09/2016  . Sinus drainage     Past Surgical History: Past Surgical History:  Procedure Laterality Date  . BLADDER REMOVAL  Nov. 1993   part  . DILATION AND CURETTAGE OF UTERUS     50+ years    Social History: Social History  Substance Use Topics  . Smoking status: Former Research scientist (life sciences)  . Smokeless tobacco: Former Systems developer    Types: Snuff  . Alcohol use No   Additional social history: Quit smoking 30 years ago, no alcohol use, no drug use Please also refer to relevant sections of EMR.  Family History: Family History  Problem Relation Age of Onset  . Cancer Father   . Cancer Sister   . Cancer Brother     Allergies and Medications: Allergies  Allergen Reactions  . Tape Other (See Comments)    SKIN IS VERY THIN; TAPE WILL TEAR AND BRUISE THE PATIENT'S SKIN!! MUST USE PAPER  TAPE  . Keflet [Cephalexin] Rash  . Sulfa Antibiotics Rash   No current facility-administered medications on file prior to encounter.    Current Outpatient Prescriptions on File Prior to Encounter  Medication Sig Dispense Refill  . acetaminophen (TYLENOL) 325 MG tablet Take 2 tablets (650 mg total) by mouth every 6 (six) hours as needed for mild pain (or Fever >/= 101). (Patient taking differently: Take 325-650 mg by mouth 2 (two) times daily. ) 30 tablet 0  . alendronate (FOSAMAX) 70 MG tablet Take 70 mg by mouth every Tuesday.     Marland Kitchen apixaban (ELIQUIS) 5 MG TABS tablet Take 1 tablet (5 mg total) by mouth 2 (two) times daily. (Patient taking differently: Take 2.5 mg by mouth 2 (two) times daily. ) 60 tablet 0   . Calcium Carbonate (CALCIUM 600 PO) Take 600 mg by mouth daily.     . Cholecalciferol (VITAMIN D-3) 1000 units CAPS Take 1,000 Units by mouth daily.     Marland Kitchen docusate sodium (COLACE) 100 MG capsule Take 100 mg by mouth daily as needed for mild constipation.    . furosemide (LASIX) 80 MG tablet Take 80 mg by mouth daily.    . Glycerin-Hypromellose-PEG 400 (VISINE TEARS) 0.2-0.2-1 % SOLN Place 1 drop into both eyes at bedtime.    . Omega-3 Fatty Acids (FISH OIL) 1000 MG CAPS Take 1,000 mg by mouth daily.    Marland Kitchen OVER THE COUNTER MEDICATION Take 1 tablet by mouth daily. Arti-Clear supplement    . QUEtiapine (SEROQUEL) 25 MG tablet Take 25 mg by mouth daily.    Marland Kitchen senna (SENOKOT) 8.6 MG TABS tablet Take 1 tablet (8.6 mg total) by mouth at bedtime. 30 each 0  . furosemide (LASIX) 20 MG tablet Take 1 tablet (20 mg total) by mouth daily. (Patient not taking: Reported on 04/25/2017) 2 tablet 0  . furosemide (LASIX) 40 MG tablet Take 1 tablet (40 mg total) by mouth daily. (Patient not taking: Reported on 03/15/2017) 30 tablet 0  . HYDROcodone-acetaminophen (NORCO/VICODIN) 5-325 MG tablet Take 0.5 tablets by mouth every 4 (four) hours as needed for moderate pain. (Patient not taking: Reported on 04/25/2017) 2 tablet 0    Objective: BP (!) 124/49   Pulse 70   Temp 98.1 F (36.7 C) (Oral)   Resp (!) 23   SpO2 100%  Exam: General: NAD, pleasant Eyes: PERRL, EOMI, no conjunctival pallor or injection ENTM: Moist mucous membranes, no pharyngeal erythema or exudate Neck: Supple, no LAD, no JVD appreciated Cardiovascular: RRR, no m/r/g Respiratory: CTA BL, normal work of breathing, decreased movement in BL bases Gastrointestinal: soft, nontender, mildly distended, normoactive BS Extremities: moves 4 extremities equally, 2+ pitting BLLE edema and significant BL erythema noted mid-calf, no warmth Derm: no rashes appreciated, significant BL erythema noted mid-calf, some closed ulcers forming on left lower  leg Neuro: CN II-XII grossly intact Psych: Alert to person and place, appropriate affect  Labs and Imaging: CBC BMET   Recent Labs Lab 04/25/17 1352  WBC 8.0  HGB 12.9  HCT 40.8  PLT 233    Recent Labs Lab 04/25/17 1352  NA 138  K 3.6  CL 97*  CO2 31  BUN 10  CREATININE 1.19*  GLUCOSE 117*  CALCIUM 9.1    BNP: 148.7, Albumin 3.0  Shirley, [place], DO 04/25/2017, 6:20 PM PGY-1, [place] Intern pager: [phone], text pages welcome  FPTS Upper-Level Resident Addendum  I have independently interviewed and examined the patient. I  have discussed the above with the original author and agree with their documentation. My edits for correction/addition/clarification are in blue. Please see also any attending notes.   Hyman Bible, MD PGY-3, [place] Service pager: [phone] (text pages welcome through [place])

## 2017-04-25 NOTE — ED Notes (Signed)
Pt incontinent of urine; peri care done, brief changed 

## 2017-04-26 DIAGNOSIS — R600 Localized edema: Secondary | ICD-10-CM

## 2017-04-26 DIAGNOSIS — J189 Pneumonia, unspecified organism: Secondary | ICD-10-CM | POA: Diagnosis not present

## 2017-04-26 DIAGNOSIS — R609 Edema, unspecified: Secondary | ICD-10-CM

## 2017-04-26 DIAGNOSIS — I509 Heart failure, unspecified: Secondary | ICD-10-CM

## 2017-04-26 DIAGNOSIS — R531 Weakness: Secondary | ICD-10-CM | POA: Diagnosis not present

## 2017-04-26 LAB — CBC
HCT: 39.3 % (ref 36.0–46.0)
HEMOGLOBIN: 12.4 g/dL (ref 12.0–15.0)
MCH: 27.4 pg (ref 26.0–34.0)
MCHC: 31.6 g/dL (ref 30.0–36.0)
MCV: 86.8 fL (ref 78.0–100.0)
Platelets: 235 10*3/uL (ref 150–400)
RBC: 4.53 MIL/uL (ref 3.87–5.11)
RDW: 15.9 % — AB (ref 11.5–15.5)
WBC: 6.7 10*3/uL (ref 4.0–10.5)

## 2017-04-26 LAB — GLUCOSE, CAPILLARY: GLUCOSE-CAPILLARY: 193 mg/dL — AB (ref 65–99)

## 2017-04-26 LAB — BASIC METABOLIC PANEL
ANION GAP: 10 (ref 5–15)
BUN: 11 mg/dL (ref 6–20)
CALCIUM: 9.1 mg/dL (ref 8.9–10.3)
CO2: 31 mmol/L (ref 22–32)
CREATININE: 1.24 mg/dL — AB (ref 0.44–1.00)
Chloride: 97 mmol/L — ABNORMAL LOW (ref 101–111)
GFR calc Af Amer: 39 mL/min — ABNORMAL LOW (ref >60)
GFR, EST NON AFRICAN AMERICAN: 34 mL/min — AB (ref >60)
GLUCOSE: 106 mg/dL — AB (ref 65–99)
Potassium: 3.2 mmol/L — ABNORMAL LOW (ref 3.5–5.1)
Sodium: 138 mmol/L (ref 135–145)

## 2017-04-26 MED ORDER — POTASSIUM CHLORIDE CRYS ER 20 MEQ PO TBCR
30.0000 meq | EXTENDED_RELEASE_TABLET | Freq: Two times a day (BID) | ORAL | Status: DC
  Administered 2017-04-26 – 2017-04-27 (×3): 30 meq via ORAL
  Filled 2017-04-26 (×3): qty 1

## 2017-04-26 NOTE — Progress Notes (Signed)
Family Medicine Teaching Service Daily Progress Note Intern Pager: [phone]  Patient name: Savannah Parks Medical record number: [id number] Date of birth: 12-Oct-1910 Age: 83 y.o. Gender: female  Primary Care Provider: Hayden Rasmussen, MD Consultants: None Code Status: DNR/DNI  Pt Overview and Major Events to Date:  Savannah Parks is a 83 y.o. female presenting with generalized weakness and swelling. PMH is significant for HFpEF, HTN, legally blind, HOH, h/o PE.  Assessment and Plan:  #Generalized weakness: Improved s/p diureses. Family notes recent decline and patient overall status with generalized weakness, worsening this past week. Patient diagnosed with PNA in clinic on Thursday, has been on Levaquin since. Question of possibility of underlying infection vs possibility of medication side effects vs CHF exacerbation. ACS unlikely with negative troponin and EKG without ST/T wave changes. - Monitor vitals routinely per floor - Regular diet with Ensure supplementation - PT:  Home health PT,Supervision for mobility/OOB - OT: pending  #Lower extremity edema:UOP: 1.5L. Improved s/p diureses. Had been worsening for a week prior to admit. Patient reports that's why she is here, to get the fluid off. Initial BNP 148.7. Wt on admit: 158? lb. Does not weigh herself at home and family does not know her dry weight. Suspecting diastolic congestive heart failure exacerbation vs third spacing secondary to malnutrition or a combination of both. Last echocardiogram done on 09/2016 with EF 70% with grade 2 diastolic dysfunction. - Continuous pulse oximetry with nasal cannula oxygen as needed to keep O2 saturations >92% - Strict I&Os and daily weights - Elevate lower extremities - restart lasix 80 mg PO BIDon discharge  #Elevated creatinine: stable. Cr 1.10. Cr 1.19 on admission, baseline 0.8. May be related to increased Lasix dose this week (normally taking 80mg  daily, but was increased to 80mg  bid by  PCP this week). Concern for third spacing and intravascular depletion. - hold lasix due improved edema  - tolerateing POs well, will trend Cr  - Avoid nephrotoxic agents - Daily BMP  #Recent diagnosis of pneumonia:Patient had a CXR 2 days ago in the office and was started on Levaquin for PNA. CXR in ED was negative for PNA, but showed Low volume chest with small effusions and vascular congestion. Will continue Levaquin due to positive CXR in office. Unable to see that CXR. - Continue 5 day course of Levaquin.   #Protein-calorie malnutrition: Albumin 3.0, total protein 5.9. - Regular diet - Ensure bid  #History of pulmonary embolus: h/o PE 09/2016 following humeral fracture in 08/2016 - Patient is continued on Eliquis 2.5 mg BID, as tolerated - Remains stable at present  #Anemia of chronic disease: stable. Hgb 12.9 on admission - continue to monitor with CBC  #GERD - Patient to continue Carafate - Currently stable  FEN/GI: Regular diet Prophylaxis: Eliquis  Disposition: discharge home w/ PT/OT  Subjective:  Pt feels fine this morning. Eating breakfast. Pt denies any abdominal pain, nausea.   Objective: Temp:  [97.5 F (36.4 C)-98.1 F (36.7 C)] 97.5 F (36.4 C) (08/19 7741) Pulse Rate:  [47-88] 71 (08/19 0820) Resp:  [16-27] 18 (08/19 0820) BP: (122-158)/(47-97) 133/60 (08/19 0820) SpO2:  [94 %-100 %] 96 % (08/19 0820) Weight:  [158 lb 3.2 oz (71.8 kg)] 158 lb 3.2 oz (71.8 kg) (08/18 1953) Physical Exam: General: eating breakfast in bed, NAD Cardiovascular: rrr, no mrg Respiratory: CTAB, normal work of breathing Abdomen: improved, mild distension, RLQ feels slightly more distended distended. Non-tender, no rebound/guarding Extremities: 1+ edema lower 1/3 of calf and ankles, erythematous,  not warm, skin dry, cracked  Laboratory:  Recent Labs Lab 04/25/17 1352 04/26/17 0307  WBC 8.0 6.7  HGB 12.9 12.4  HCT 40.8 39.3  PLT 233 235    Recent Labs Lab  04/25/17 1352 04/26/17 0307  NA 138 138  K 3.6 3.2*  CL 97* 97*  CO2 31 31  BUN 10 11  CREATININE 1.19* 1.24*  CALCIUM 9.1 9.1  PROT 5.9*  --   BILITOT 0.3  --   ALKPHOS 54  --   ALT 8*  --   AST 19  --   GLUCOSE 117* 106*      Imaging/Diagnostic Tests: Dg Chest 2 View  Result Date: 04/25/2017 CLINICAL DATA:  Cough for a week. EXAM: CHEST  2 VIEW COMPARISON:  12/14/2016 FINDINGS: Low volume chest with small pleural effusions. There is pulmonary vascular congestion without generalized Kerley lines. No asymmetric opacity, air bronchogram, or pneumothorax. Normal heart size. Distorted proximal right humerus from remote fracture. IMPRESSION: Low volume chest with small effusions and vascular congestion. Electronically Signed   By: Monte Fantasia M.D.   On: 04/25/2017 13:42    Bonnita Hollow, MD 04/26/2017, 9:55 AM PGY-1, [place] Intern pager: [phone], text pages welcome

## 2017-04-26 NOTE — Care Management Obs Status (Signed)
[facility] NOTIFICATION   Patient Details  Name: Savannah Parks MRN: [id number] Date of Birth: 06-Mar-1911   Medicare Observation Status Notification Given:  Yes    CrutchfieldAntony Haste, RN 04/26/2017, 1:36 PM

## 2017-04-26 NOTE — Discharge Summary (Signed)
[facility] Discharge Summary  Patient name: Savannah Parks Medical record number: [id number] Date of birth: 1911/09/04 Age: 83 y.o. Gender: female Date of Admission: 04/25/2017  Date of Discharge: 04/26/2017 Admitting Physician: Zenia Resides, MD  Primary Care Provider: Hayden Rasmussen, MD Consultants: None  Indication for Hospitalization: Generalized weakness and lower extremity swelling  Discharge Diagnoses/Problem List:  Generalized weakness, improved Lower extremity swelling HFpEF HTN Legally Blind HOH H/o PE  Disposition: Home w/ [facility] PT/OT  Discharge Condition: Stable  Discharge Exam:  General: eating breakfast in bed, NAD Cardiovascular: rrr, no mrg Respiratory: CTAB, normal work of breathing Abdomen: improved, mild distension, RLQ feels slightly more distended distended. Non-tender, no rebound/guarding Extremities: 1+ edema lower 1/3 of calf and ankles, erythematous, not warm, skin dry, cracked  Brief Hospital Course:  Savannah Parks a 83 y.o.femalepresenting with generalized weakness and swelling of her lower extremities. It significantly improved after dieresis. Patient did not require any supplemental oxygen after dieresis. Given rapid improvement with diuresis, it was likely due to CHF exacerbation. Patient improved to prior state of health and was discharged home to resume [facility] with PT/OT  Issues for Follow Up:  1. Elevated Cr. - Pt ha an elevated Cr on admission of 1.19. It was stable around 1.10 on discharge. Please recheck BMP.  2. Fluid Status -  Discharge with Lasix 80 mg BID. Please check patient fluid status in follow up and adjust lasix dose as necessary.   Significant Procedures: None  Significant Labs and Imaging:   Recent Labs Lab 04/25/17 1352 04/26/17 0307 04/27/17 0308  WBC 8.0 6.7 6.8  HGB 12.9 12.4 12.3  HCT 40.8 39.3 38.6  PLT 233 235 236    Recent Labs Lab 04/25/17 1352 04/26/17 0307  04/27/17 0308  NA 138 138 138  K 3.6 3.2* 3.9  CL 97* 97* 98*  CO2 31 31 30   GLUCOSE 117* 106* 91  BUN 10 11 16   CREATININE 1.19* 1.24* 1.10*  CALCIUM 9.1 9.1 9.3  ALKPHOS 54  --   --   AST 19  --   --   ALT 8*  --   --   ALBUMIN 3.0*  --   --       Results/Tests Pending at Time of Discharge: None  Discharge Medications:  Allergies as of 04/27/2017      Reactions   Tape Other (See Comments)   SKIN IS VERY THIN; TAPE WILL TEAR AND BRUISE THE PATIENT'S SKIN!! MUST USE PAPER TAPE   Keflet [cephalexin] Rash   Sulfa Antibiotics Rash      Medication List    STOP taking these medications   HYDROcodone-acetaminophen 5-325 MG tablet Commonly known as:  NORCO/VICODIN     TAKE these medications   acetaminophen 325 MG tablet Commonly known as:  TYLENOL Take 2 tablets (650 mg total) by mouth every 6 (six) hours as needed for mild pain (or Fever >/= 101). What changed:  how much to take  when to take this   alendronate 70 MG tablet Commonly known as:  FOSAMAX Take 70 mg by mouth every Tuesday.   apixaban 5 MG Tabs tablet Commonly known as:  ELIQUIS Take 1 tablet (5 mg total) by mouth 2 (two) times daily. What changed:  how much to take   CALCIUM 600 PO Take 600 mg by mouth daily.   docusate sodium 100 MG capsule Commonly known as:  COLACE Take 100 mg by mouth daily as needed for mild  constipation.   feeding supplement (ENSURE ENLIVE) Liqd Take 237 mLs by mouth 2 (two) times daily between meals.   Fish Oil 1000 MG Caps Take 1,000 mg by mouth daily.   furosemide 80 MG tablet Commonly known as:  LASIX Take 1 tablet (80 mg total) by mouth 2 (two) times daily. What changed:  when to take this  Another medication with the same name was removed. Continue taking this medication, and follow the directions you see here.   KLOR-CON 10 10 MEQ tablet Generic drug:  potassium chloride Take 10 mEq by mouth daily.   OVER THE COUNTER MEDICATION Take 1 tablet by  mouth daily. Arti-Clear supplement   QUEtiapine 25 MG tablet Commonly known as:  SEROQUEL Take 25 mg by mouth daily.   senna 8.6 MG Tabs tablet Commonly known as:  SENOKOT Take 1 tablet (8.6 mg total) by mouth at bedtime.   sucralfate 1 g tablet Commonly known as:  CARAFATE Take 1 g by mouth 2 (two) times daily as needed (ulcers).   VISINE TEARS 0.2-0.2-1 % Soln Generic drug:  Glycerin-Hypromellose-PEG 400 Place 1 drop into both eyes at bedtime.   Vitamin D-3 1000 units Caps Take 1,000 Units by mouth daily.            Discharge Care Instructions        Start     Ordered   04/27/17 0000  feeding supplement, ENSURE ENLIVE, (ENSURE ENLIVE) LIQD  2 times daily between meals     04/27/17 1153   04/27/17 0000  furosemide (LASIX) 80 MG tablet  2 times daily     04/27/17 1153   04/27/17 0000  Increase activity slowly     04/27/17 1153   04/27/17 0000  Diet - low sodium heart healthy     04/27/17 1153      Discharge Instructions: Please refer to Patient Instructions section of EMR for full details.  Patient was counseled important signs and symptoms that should prompt return to medical care, changes in medications, dietary instructions, activity restrictions, and follow up appointments.   Follow-Up Appointments: Follow-up Information    Hayden Rasmussen, MD. Schedule an appointment as soon as possible for a visit.   Specialty:  Family Medicine Why:  Please see PCP within 3-5 days for a hospital follow up. You will need to call to schedule this.  Contact information: [place] [phone]           Bonnita Hollow, MD 04/30/2017, 12:16 PM PGY-1, [place]

## 2017-04-26 NOTE — Evaluation (Signed)
Physical Therapy Evaluation Patient Details Name: Savannah Parks MRN: [id number] DOB: 10-15-1910 Today's Date: 04/26/2017   History of Present Illness  Pt is a 83 yo female admitted through ED on 04/25/17 with weakness, LE swelling and elevated creatinine with a diagnosis of PNA that was being treated outpatient. PMH significant for GERD, Diastolic HF, FTT, PE 03/3418, HTN, bradycardia, bladder CA, legally blind and HOH.   Clinical Impression  Pt presents with the above diagnosis and below deficits for therapy evaluation. Prior to admission, pt lived with her daughter in a single level home and her son lives next door. Pt required assistance for most ADL's and IADL's and performed very short distance gait with her 4 wheeled RW. Pt requires Min A for all mobility this session due to weakness and fatigue. Pt will benefit from continued acute PT follow-up in order to address the below deficits prior to D/C.     Follow Up Recommendations Home health PT;Supervision for mobility/OOB    Equipment Recommendations  None recommended by PT    Recommendations for Other Services       Precautions / Restrictions Precautions Precautions: Fall Precaution Comments: legally blind and HOH Restrictions Weight Bearing Restrictions: No      Mobility  Bed Mobility Overal bed mobility: Needs Assistance Bed Mobility: Sit to Supine       Sit to supine: Min assist   General bed mobility comments: Min A to bring LE's into bed  Transfers Overall transfer level: Needs assistance Equipment used: Rolling walker (2 wheeled) Transfers: Sit to/from Stand Sit to Stand: Mod assist         General transfer comment: Mod A to stand from recliner   Ambulation/Gait Ambulation/Gait assistance: Min assist Ambulation Distance (Feet): 35 Feet Assistive device: Rolling walker (2 wheeled) Gait Pattern/deviations: Step-through pattern;Decreased step length - right;Decreased step length - left;Decreased stride  length;Trunk flexed Gait velocity: decreased Gait velocity interpretation: <1.8 ft/sec, indicative of risk for recurrent falls General Gait Details: increased forward trunk lean with gait with cues for proximity to RW. No LOB noted with mobility. Min A for safety  Stairs            Wheelchair Mobility    Modified Rankin (Stroke Patients Only)       Balance Overall balance assessment: Needs assistance Sitting-balance support: No upper extremity supported;Feet supported Sitting balance-Leahy Scale: Fair     Standing balance support: Bilateral upper extremity supported;During functional activity Standing balance-Leahy Scale: Poor Standing balance comment: reliant on UE support in standing                             Pertinent Vitals/Pain Pain Assessment: No/denies pain    Home Living Family/patient expects to be discharged to:: Private residence Living Arrangements: Children Available Help at Discharge: Family;Available 24 hours/day Type of Home: House Home Access: Stairs to enter Entrance Stairs-Rails: None Entrance Stairs-Number of Steps: 1 + 1 Home Layout: One level Home Equipment: Walker - 4 wheels;Walker - 2 wheels;Hospital bed Additional Comments: Pt's son lives next door.     Prior Function Level of Independence: Needs assistance   Gait / Transfers Assistance Needed: Pt is able to ambulate short distances in her home from bedroom to dining room  ADL's / Homemaking Assistance Needed: family assists with all bathing and dressing due to loss of vision        Hand Dominance   Dominant Hand: Right    Extremity/Trunk Assessment  Upper Extremity Assessment Upper Extremity Assessment: Defer to OT evaluation    Lower Extremity Assessment Lower Extremity Assessment: Generalized weakness    Cervical / Trunk Assessment Cervical / Trunk Assessment: Kyphotic  Communication   Communication: No difficulties;HOH  Cognition Arousal/Alertness:  Awake/alert Behavior During Therapy: WFL for tasks assessed/performed Overall Cognitive Status: Within Functional Limits for tasks assessed                                        General Comments General comments (skin integrity, edema, etc.): Son and daugther present throughout session    Exercises     Assessment/Plan    PT Assessment Patient needs continued PT services  PT Problem List Decreased strength;Decreased activity tolerance;Decreased balance;Decreased mobility       PT Treatment Interventions DME instruction;Gait training;Stair training;Functional mobility training;Therapeutic activities;Therapeutic exercise;Balance training    PT Goals (Current goals can be found in the Care Plan section)  Acute Rehab PT Goals Patient Stated Goal: to feel better  PT Goal Formulation: With patient Time For Goal Achievement: 05/03/17 Potential to Achieve Goals: Good    Frequency Min 3X/week   Barriers to discharge        Co-evaluation               AM-PAC PT "6 Clicks" Daily Activity  Outcome Measure Difficulty turning over in bed (including adjusting bedclothes, sheets and blankets)?: Unable Difficulty moving from lying on back to sitting on the side of the bed? : Unable Difficulty sitting down on and standing up from a chair with arms (e.g., wheelchair, bedside commode, etc,.)?: Unable Help needed moving to and from a bed to chair (including a wheelchair)?: A Little Help needed walking in hospital room?: A Little Help needed climbing 3-5 steps with a railing? : A Lot 6 Click Score: 11    End of Session Equipment Utilized During Treatment: Gait belt Activity Tolerance: Patient limited by fatigue Patient left: in bed;with call bell/phone within reach;with family/visitor present Nurse Communication: Mobility status PT Visit Diagnosis: Unsteadiness on feet (R26.81);Muscle weakness (generalized) (M62.81);Difficulty in walking, not elsewhere classified  (R26.2)    Time: 1339-1400 PT Time Calculation (min) (ACUTE ONLY): 21 min   Charges:   PT Evaluation $PT Eval Moderate Complexity: 1 Mod     PT G Codes:   PT G-Codes **NOT FOR INPATIENT CLASS** Functional Assessment Tool Used: AM-PAC 6 Clicks Basic Mobility;Clinical judgement Functional Limitation: Mobility: Walking and moving around Mobility: Walking and Moving Around Current Status (K9179): At least 60 percent but less than 80 percent impaired, limited or restricted Mobility: Walking and Moving Around Goal Status [phone]): At least 20 percent but less than 40 percent impaired, limited or restricted    Scheryl Marten PT, DPT  [phone]   Shanon Rosser 04/26/2017, 3:34 PM

## 2017-04-26 NOTE — Care Management CC44 (Signed)
Condition Code 44 Documentation Completed  Patient Details  Name: Savannah Parks MRN: [id number] Date of Birth: 12-24-1910   Condition Code 44 given:  Yes Patient signature on Condition Code 44 notice:  Yes Documentation of 2 MD's agreement:  Yes Code 44 added to claim:  Yes    Savannah Parks, Antony Haste, RN 04/26/2017, 1:36 PM

## 2017-04-27 LAB — BASIC METABOLIC PANEL
Anion gap: 10 (ref 5–15)
BUN: 16 mg/dL (ref 6–20)
CALCIUM: 9.3 mg/dL (ref 8.9–10.3)
CO2: 30 mmol/L (ref 22–32)
CREATININE: 1.1 mg/dL — AB (ref 0.44–1.00)
Chloride: 98 mmol/L — ABNORMAL LOW (ref 101–111)
GFR calc Af Amer: 45 mL/min — ABNORMAL LOW (ref >60)
GFR, EST NON AFRICAN AMERICAN: 39 mL/min — AB (ref >60)
GLUCOSE: 91 mg/dL (ref 65–99)
Potassium: 3.9 mmol/L (ref 3.5–5.1)
SODIUM: 138 mmol/L (ref 135–145)

## 2017-04-27 LAB — CBC
HCT: 38.6 % (ref 36.0–46.0)
Hemoglobin: 12.3 g/dL (ref 12.0–15.0)
MCH: 27.5 pg (ref 26.0–34.0)
MCHC: 31.9 g/dL (ref 30.0–36.0)
MCV: 86.4 fL (ref 78.0–100.0)
PLATELETS: 236 10*3/uL (ref 150–400)
RBC: 4.47 MIL/uL (ref 3.87–5.11)
RDW: 15.8 % — AB (ref 11.5–15.5)
WBC: 6.8 10*3/uL (ref 4.0–10.5)

## 2017-04-27 LAB — GLUCOSE, CAPILLARY: GLUCOSE-CAPILLARY: 83 mg/dL (ref 65–99)

## 2017-04-27 MED ORDER — ENSURE ENLIVE PO LIQD: 237.0000 mL | Freq: Two times a day (BID) | ORAL | 12 refills | Status: AC

## 2017-04-27 MED ORDER — FUROSEMIDE 80 MG PO TABS: 80.0000 mg | ORAL_TABLET | Freq: Two times a day (BID) | ORAL | 0 refills | Status: AC

## 2017-04-27 NOTE — Care Management Note (Signed)
Case Management Note  Patient Details  Name: Mechele Rome MRN: [id number] Date of Birth: 01/05/1911  Subjective/Objective:         CM following for progression and d/c planning.            Action/Plan: 04/27/2017 Met with pt who is unable to tell this CM her HH agency. Spoke with pt son, Mr Floyd Perleberg, who states that pt HH services are with [facility]. This CM notifed [facility] rep Tim of plan to d/c today and resume orders entered. Pt pt son, pt has no further DME needs.   Expected Discharge Date:  04/27/17               Expected Discharge Plan:  Home w Home Health Services  In-House Referral:  NA  Discharge planning Services  CM Consult  Post Acute Care Choice:  Home Health Choice offered to:  Adult Children  DME Arranged:   NA DME Agency:   NA  HH Arranged:  OT, PT, Social Work, Nurse's Aide HH Agency:  [facility] (now [facility])  Status of Service:  Completed, signed off  If discussed at Long Length of Stay Meetings, dates discussed:    Additional Comments:  ,  U, RN 04/27/2017, 1:46 PM  

## 2017-04-27 NOTE — Discharge Instructions (Signed)
You were admitted to the hospital for fluid overload thought to be because of congestive heart failure. You have urinated a good amount of fluid off of your body and are now stable to go home. We have placed orders for you to receive physical therapy at home.   At home: Please take Lasix 80 mg twice a day (morning and night) until you are able to see your PCP.   It was a pleasure caring for you, take care!   Heart Failure Heart failure means your heart has trouble pumping blood. This makes it hard for your body to work well. Heart failure is usually a long-term (chronic) condition. You must take good care of yourself and follow your doctor's treatment plan. Follow these instructions at home:  Take your heart medicine as told by your doctor. ? Do not stop taking medicine unless your doctor tells you to. ? Do not skip any dose of medicine. ? Refill your medicines before they run out. ? Take other medicines only as told by your doctor or pharmacist.  Stay active if told by your doctor. The elderly and people with severe heart failure should talk with a doctor about physical activity.  Eat heart-healthy foods. Choose foods that are without trans fat and are low in saturated fat, cholesterol, and salt (sodium). This includes fresh or frozen fruits and vegetables, fish, lean meats, fat-free or low-fat dairy foods, whole grains, and high-fiber foods. Lentils and dried peas and beans (legumes) are also good choices.  Limit salt if told by your doctor.  Cook in a healthy way. Roast, grill, broil, bake, poach, steam, or stir-fry foods.  Limit fluids as told by your doctor.  Weigh yourself every morning. Do this after you pee (urinate) and before you eat breakfast. Write down your weight to give to your doctor.  Take your blood pressure and write it down if your doctor tells you to.  Ask your doctor how to check your pulse. Check your pulse as told.  Lose weight if told by your doctor.  Stop  smoking or chewing tobacco. Do not use gum or patches that help you quit without your doctor's approval.  Schedule and go to doctor visits as told.  Nonpregnant women should have no more than 1 drink a day. Men should have no more than 2 drinks a day. Talk to your doctor about drinking alcohol.  Stop illegal drug use.  Stay current with shots (immunizations).  Manage your health conditions as told by your doctor.  Learn to manage your stress.  Rest when you are tired.  If it is really hot outside: ? Avoid intense activities. ? Use air conditioning or fans, or get in a cooler place. ? Avoid caffeine and alcohol. ? Wear loose-fitting, lightweight, and light-colored clothing.  If it is really cold outside: ? Avoid intense activities. ? Layer your clothing. ? Wear mittens or gloves, a hat, and a scarf when going outside. ? Avoid alcohol.  Learn about heart failure and get support as needed.  Get help to maintain or improve your quality of life and your ability to care for yourself as needed. Contact a doctor if:  You gain weight quickly.  You are more short of breath than usual.  You cannot do your normal activities.  You tire easily.  You cough more than normal, especially with activity.  You have any or more puffiness (swelling) in areas such as your hands, feet, ankles, or belly (abdomen).  You cannot sleep  because it is hard to breathe.  You feel like your heart is beating fast (palpitations).  You get dizzy or light-headed when you stand up. Get help right away if:  You have trouble breathing.  There is a change in mental status, such as becoming less alert or not being able to focus.  You have chest pain or discomfort.  You faint. This information is not intended to replace advice given to you by your health care provider. Make sure you discuss any questions you have with your health care provider. Document Released: 06/03/2008 Document Revised:  01/31/2016 Document Reviewed: 10/11/2012 Elsevier Interactive Patient Education  2017 [employer].

## 2017-04-27 NOTE — Evaluation (Signed)
Occupational Therapy Evaluation Patient Details Name: Savannah Parks MRN: [id number] DOB: [birth year] Today's Date: 04/27/2017    History of Present Illness Pt is a 83 yo female admitted through ED on 04/25/17 with weakness, LE swelling and elevated creatinine with a diagnosis of PNA that was being treated outpatient. PMH significant for GERD, Diastolic HF, FTT, PE 01/4626, HTN, bradycardia, bladder CA, legally blind and HOH.    Clinical Impression   PTA, pt reports that she lived with her daughter who assisted with all ADLs and performed all IADLs. Pt also reports that her son lives nearby to assist as needed. Pt currently demonstrating near baseline function with Min Guard A for grooming at sink, Max A for toilet hygiene, and Mod A for UB dressing. Recommend dc home with HHOT to increase safety and independence with BADLs and functional mobility. Will continue to follow acutely to insure consistency of occupational performance and facilitate safe dc.     Follow Up Recommendations  Home health OT;Supervision/Assistance - 24 hour    Equipment Recommendations  None recommended by OT    Recommendations for Other Services PT consult     Precautions / Restrictions Precautions Precautions: Fall Precaution Comments: legally blind and HOH Restrictions Weight Bearing Restrictions: No      Mobility Bed Mobility Overal bed mobility: Needs Assistance Bed Mobility: Sit to Supine       Sit to supine: Min assist;HOB elevated   General bed mobility comments: Min A for single hand hold and to transition hips towards EOB  Transfers Overall transfer level: Needs assistance Equipment used: Rolling walker (2 wheeled) Transfers: Sit to/from Stand Sit to Stand: Min assist         General transfer comment: Min A to stand and steady in standing    Balance Overall balance assessment: Needs assistance Sitting-balance support: No upper extremity supported;Feet supported Sitting balance-Leahy  Scale: Fair     Standing balance support: Bilateral upper extremity supported;During functional activity Standing balance-Leahy Scale: Poor Standing balance comment: reliant on UE support in standing                           ADL either performed or assessed with clinical judgement   ADL Overall ADL's : Needs assistance/impaired Eating/Feeding: Set up;Sitting   Grooming: Wash/dry face;Standing;Min guard Grooming Details (indicate cue type and reason): Performed grooming at sink with [employer] for safety. Pt with significant bend at hips and lean on sink. Pt requiring tactile and verbal cues due to low vision/blindness. pt fatigues quickly         Upper Body Dressing : Moderate assistance;Sitting Upper Body Dressing Details (indicate cue type and reason): Mod A to don sweater at [place] and Hygiene: Maximal assistance;Sit to/from stand Toileting - Clothing Manipulation Details (indicate cue type and reason): Pt requiring Max A for toilet hygiene while maintaining standing with bil UE support after urinary incontinence.      Functional mobility during ADLs: Min guard;Rolling walker General ADL Comments: Pt performing near baseline function. Pt reports that she requires assistance for ADLs at home. Pt performed functional mobility with sink with Min Guard and rollator. Pt performed grooming at sink with Min Guard and then required to take a rest break.      Vision Baseline Vision/History: Legally blind;Wears glasses Wears Glasses: At all times Patient Visual Report: No change from baseline       Perception  Praxis      Pertinent Vitals/Pain Pain Assessment: No/denies pain     Hand Dominance Right   Extremity/Trunk Assessment Upper Extremity Assessment Upper Extremity Assessment: Generalized weakness   Lower Extremity Assessment Lower Extremity Assessment: Defer to PT evaluation   Cervical / Trunk  Assessment Cervical / Trunk Assessment: Kyphotic   Communication Communication Communication: No difficulties;HOH   Cognition Arousal/Alertness: Awake/alert Behavior During Therapy: WFL for tasks assessed/performed Overall Cognitive Status: Within Functional Limits for tasks assessed                                     General Comments  Edema at BLE    Exercises     Shoulder Instructions      Home Living Family/patient expects to be discharged to:: Private residence Living Arrangements: Children (Daughter) Available Help at Discharge: Family;Available 24 hours/day Type of Home: House Home Access: Stairs to enter [employer] of Steps: 1 + 1 Entrance Stairs-Rails: None Home Layout: One level     Bathroom Shower/Tub: Teacher, early years/pre: Standard     Home Equipment: Environmental consultant - 4 wheels;Walker - 2 wheels;Hospital bed;Bedside commode;Wheelchair - manual   Additional Comments: Pt's son lives next door.       Prior Functioning/Environment Level of Independence: Needs assistance  Gait / Transfers Assistance Needed: Pt is able to ambulate short distances in her home from bedroom to dining room ADL's / Homemaking Assistance Needed: family assists with all bathing and dressing due to loss of vision   Comments: Pt reports that her daughter and son assists her with all BADLs. Pt also reports that a caregivercomes in 2-3 times a week to assist.         OT Problem List: Decreased range of motion;Decreased activity tolerance;Impaired balance (sitting and/or standing);Decreased safety awareness;Decreased knowledge of use of DME or AE      OT Treatment/Interventions: Self-care/ADL training;Therapeutic exercise;Energy conservation;DME and/or AE instruction;Therapeutic activities;Patient/family education    OT Goals(Current goals can be found in the care plan section) Acute Rehab OT Goals Patient Stated Goal: to feel better  OT Goal  Formulation: With patient Time For Goal Achievement: 05/11/17 Potential to Achieve Goals: Good ADL Goals Pt Will Perform Upper Body Dressing: with min assist;sitting Pt Will Perform Lower Body Dressing: with mod assist;sit to/from stand Pt Will Transfer to Toilet: bedside commode;ambulating;with min guard assist Pt Will Perform Toileting - Clothing Manipulation and hygiene: sit to/from stand;with mod assist  OT Frequency: Min 2X/week   Barriers to D/C:            Co-evaluation PT/OT/SLP Co-Evaluation/Treatment: Yes Reason for Co-Treatment: For patient/therapist safety;To address functional/ADL transfers PT goals addressed during session: Mobility/safety with mobility OT goals addressed during session: ADL's and self-care      AM-PAC PT "6 Clicks" Daily Activity     Outcome Measure Help from another person eating meals?: A Little Help from another person taking care of personal grooming?: A Little Help from another person toileting, which includes using toliet, bedpan, or urinal?: A Lot Help from another person bathing (including washing, rinsing, drying)?: A Lot Help from another person to put on and taking off regular upper body clothing?: A Lot Help from another person to put on and taking off regular lower body clothing?: A Lot 6 Click Score: 14   End of Session Equipment Utilized During Treatment: Gait belt;Rolling walker Nurse Communication: Mobility status  Activity  Tolerance: Patient tolerated treatment well;Patient limited by fatigue Patient left: in chair;with call bell/phone within reach;with chair alarm set  OT Visit Diagnosis: Unsteadiness on feet (R26.81);Other abnormalities of gait and mobility (R26.89);Muscle weakness (generalized) (M62.81);Low vision, both eyes (H54.2)                Time: 6825-7493 OT Time Calculation (min): 37 min Charges:  OT General Charges $OT Visit: 1 Procedure OT Evaluation $OT Eval Low Complexity: 1 Procedure G-Codes: OT G-codes  **NOT FOR INPATIENT CLASS** Functional Assessment Tool Used: Clinical judgement Functional Limitation: Self care Self Care Current Status (X5217): At least 60 percent but less than 80 percent impaired, limited or restricted Self Care Goal Status (G7159): At least 20 percent but less than 40 percent impaired, limited or restricted   [place], OTR/L Acute Rehab Pager: [phone] Office: [place] 04/27/2017, 1:06 PM

## 2017-04-27 NOTE — Progress Notes (Signed)
Physical Therapy Treatment Patient Details Name: Savannah Parks MRN: [id number] DOB: 1910-10-21 Today's Date: 04/27/2017    History of Present Illness Pt is a 83 yo female admitted through ED on 04/25/17 with weakness, LE swelling and elevated creatinine with a diagnosis of PNA that was being treated outpatient. PMH significant for GERD, Diastolic HF, FTT, PE 12/4032, HTN, bradycardia, bladder CA, legally blind and HOH.     PT Comments    Continuing work on functional mobility and activity tolerance;  Used Rollator RW today; unfortunately Rollator proved unhelpful as it was not an optimal fit for Savannah Parks; Continue to recommend dc home to familiar environment, caregivers, and routine   Follow Up Recommendations  Home health PT;Supervision for mobility/OOB     Equipment Recommendations  None recommended by PT    Recommendations for Other Services       Precautions / Restrictions Precautions Precautions: Fall Precaution Comments: legally blind and HOH Restrictions Weight Bearing Restrictions: No    Mobility  Bed Mobility Overal bed mobility: Needs Assistance Bed Mobility: Supine to Sit     Supine to sit: Min assist Sit to supine: Min assist;HOB elevated   General bed mobility comments: Min A for single hand hold and to transition hips towards EOB  Transfers Overall transfer level: Needs assistance Equipment used: Rolling walker (2 wheeled) Transfers: Sit to/from Stand Sit to Stand: Min assist         General transfer comment: Min A to stand and steady in standing  Ambulation/Gait Ambulation/Gait assistance: Min assist Ambulation Distance (Feet): 20 Feet (to and from sink) Assistive device: Rolling walker (2 wheeled) Gait Pattern/deviations: Step-through pattern;Decreased step length - right;Decreased step length - left;Decreased stride length;Trunk flexed Gait velocity: decreased   General Gait Details: increased forward trunk lean with gait with cues for  proximity to RW. No LOB noted with mobility. Min A for safety; forwrd trunk lean increases with fatigue to where hip flexion angle approached right angle towards end of walk   Stairs            Wheelchair Mobility    Modified Rankin (Stroke Patients Only)       Balance Overall balance assessment: Needs assistance Sitting-balance support: No upper extremity supported;Feet supported Sitting balance-Leahy Scale: Fair     Standing balance support: Bilateral upper extremity supported;During functional activity Standing balance-Leahy Scale: Poor Standing balance comment: reliant on UE support in standing                            Cognition Arousal/Alertness: Awake/alert Behavior During Therapy: WFL for tasks assessed/performed Overall Cognitive Status: Within Functional Limits for tasks assessed                                        Exercises      General Comments General comments (skin integrity, edema, etc.): Edema at BLE      Pertinent Vitals/Pain Pain Assessment: No/denies pain    Home Living Family/patient expects to be discharged to:: Private residence Living Arrangements: Children (Daughter) Available Help at Discharge: Family;Available 24 hours/day Type of Home: House Home Access: Stairs to enter Entrance Stairs-Rails: None Home Layout: One level Home Equipment: Environmental consultant - 4 wheels;Walker - 2 wheels;Hospital bed;Bedside commode;Wheelchair - manual Additional Comments: Pt's son lives next door.     Prior Function Level of Independence: Needs assistance  Gait /  Transfers Assistance Needed: Pt is able to ambulate short distances in her home from bedroom to dining room ADL's / Homemaking Assistance Needed: family assists with all bathing and dressing due to loss of vision Comments: Pt reports that her daughter and son assists her with all BADLs. Pt also reports that a caregivercomes in 2-3 times a week to assist.    PT Goals  (current goals can now be found in the care plan section) Acute Rehab PT Goals Patient Stated Goal: to feel better  PT Goal Formulation: With patient Time For Goal Achievement: 05/03/17 Potential to Achieve Goals: Good Progress towards PT goals: Progressing toward goals    Frequency    Min 3X/week      PT Plan Current plan remains appropriate    Co-evaluation PT/OT/SLP Co-Evaluation/Treatment: Yes Reason for Co-Treatment: For patient/therapist safety PT goals addressed during session: Mobility/safety with mobility OT goals addressed during session: ADL's and self-care      AM-PAC PT "6 Clicks" Daily Activity  Outcome Measure  Difficulty turning over in bed (including adjusting bedclothes, sheets and blankets)?: Unable Difficulty moving from lying on back to sitting on the side of the bed? : Unable Difficulty sitting down on and standing up from a chair with arms (e.g., wheelchair, bedside commode, etc,.)?: Unable Help needed moving to and from a bed to chair (including a wheelchair)?: A Little Help needed walking in hospital room?: A Little Help needed climbing 3-5 steps with a railing? : A Lot 6 Click Score: 11    End of Session Equipment Utilized During Treatment: Gait belt Activity Tolerance: Patient limited by fatigue Patient left: in chair;with call bell/phone within reach;with chair alarm set Nurse Communication: Mobility status PT Visit Diagnosis: Unsteadiness on feet (R26.81);Muscle weakness (generalized) (M62.81);Difficulty in walking, not elsewhere classified (R26.2)     Time: 4665-9935 PT Time Calculation (min) (ACUTE ONLY): 31 min  Charges:  $Gait Training: 8-22 mins                    G Codes:       Roney Marion, PT  Acute Rehabilitation Services Pager [phone] Office [place] 04/27/2017, 2:19 PM

## 2017-04-27 NOTE — Progress Notes (Signed)
Pt discharged home with son and daughter. AVS reviewed with and given to patient's family. They acknowledge changes with lasix prescription and know that home health has been set up. They acknowledge follow-up with PCP in 3-5 days per MD order. VSS. BP (!) 131/51 (BP Location: Left Arm)   Pulse 70   Temp 98.2 F (36.8 C) (Oral)   Resp 16   Ht 5\' 3"  (1.6 m)   Wt 71.8 kg (158 lb 3.2 oz)   SpO2 95%   BMI 28.02 kg/m

## 2017-05-14 ENCOUNTER — Other Ambulatory Visit: Payer: Self-pay | Admitting: Family Medicine

## 2017-05-15 ENCOUNTER — Other Ambulatory Visit: Payer: Self-pay | Admitting: Family Medicine

## 2017-07-31 ENCOUNTER — Other Ambulatory Visit: Payer: Self-pay | Admitting: Family Medicine

## 2017-08-03 ENCOUNTER — Other Ambulatory Visit: Payer: Self-pay | Admitting: Family Medicine

## 2017-08-04 ENCOUNTER — Other Ambulatory Visit: Payer: Self-pay | Admitting: Family Medicine

## 2017-08-06 ENCOUNTER — Other Ambulatory Visit: Payer: Self-pay | Admitting: Family Medicine

## 2017-08-07 NOTE — Telephone Encounter (Signed)
Contacted pharmacy and told them that we have received multiple refills for this medication and have refused them each time because we are not pt primary care. Pharmacy stated that they would send it to correct doctor for refills.Katharina Caper, April D, [place]

## 2018-07-21 ENCOUNTER — Emergency Department (HOSPITAL_COMMUNITY)

## 2018-07-21 ENCOUNTER — Emergency Department (HOSPITAL_COMMUNITY)
Admission: EM | Admit: 2018-07-21 | Discharge: 2018-07-21 | Disposition: A | Discharge status: Home / Self Care | Attending: Emergency Medicine | Admitting: Emergency Medicine

## 2018-07-21 ENCOUNTER — Other Ambulatory Visit: Payer: Self-pay

## 2018-07-21 ENCOUNTER — Encounter (HOSPITAL_COMMUNITY): Payer: Self-pay | Admitting: Emergency Medicine

## 2018-07-21 DIAGNOSIS — M791 Myalgia, unspecified site: Secondary | ICD-10-CM | POA: Diagnosis not present

## 2018-07-21 DIAGNOSIS — Y999 Unspecified external cause status: Secondary | ICD-10-CM | POA: Insufficient documentation

## 2018-07-21 DIAGNOSIS — Z79899 Other long term (current) drug therapy: Secondary | ICD-10-CM | POA: Insufficient documentation

## 2018-07-21 DIAGNOSIS — S51001A Unspecified open wound of right elbow, initial encounter: Secondary | ICD-10-CM | POA: Insufficient documentation

## 2018-07-21 DIAGNOSIS — Z87891 Personal history of nicotine dependence: Secondary | ICD-10-CM | POA: Insufficient documentation

## 2018-07-21 DIAGNOSIS — S59901A Unspecified injury of right elbow, initial encounter: Secondary | ICD-10-CM | POA: Diagnosis present

## 2018-07-21 DIAGNOSIS — R51 Headache: Secondary | ICD-10-CM | POA: Insufficient documentation

## 2018-07-21 DIAGNOSIS — Y92009 Unspecified place in unspecified non-institutional (private) residence as the place of occurrence of the external cause: Secondary | ICD-10-CM | POA: Insufficient documentation

## 2018-07-21 DIAGNOSIS — Z23 Encounter for immunization: Secondary | ICD-10-CM | POA: Insufficient documentation

## 2018-07-21 DIAGNOSIS — I10 Essential (primary) hypertension: Secondary | ICD-10-CM | POA: Insufficient documentation

## 2018-07-21 DIAGNOSIS — R2243 Localized swelling, mass and lump, lower limb, bilateral: Secondary | ICD-10-CM | POA: Insufficient documentation

## 2018-07-21 DIAGNOSIS — Z7901 Long term (current) use of anticoagulants: Secondary | ICD-10-CM | POA: Insufficient documentation

## 2018-07-21 DIAGNOSIS — Y939 Activity, unspecified: Secondary | ICD-10-CM | POA: Diagnosis not present

## 2018-07-21 DIAGNOSIS — M542 Cervicalgia: Secondary | ICD-10-CM | POA: Insufficient documentation

## 2018-07-21 DIAGNOSIS — Z8551 Personal history of malignant neoplasm of bladder: Secondary | ICD-10-CM | POA: Insufficient documentation

## 2018-07-21 DIAGNOSIS — S41111A Laceration without foreign body of right upper arm, initial encounter: Secondary | ICD-10-CM

## 2018-07-21 DIAGNOSIS — W010XXA Fall on same level from slipping, tripping and stumbling without subsequent striking against object, initial encounter: Secondary | ICD-10-CM | POA: Insufficient documentation

## 2018-07-21 DIAGNOSIS — W19XXXA Unspecified fall, initial encounter: Secondary | ICD-10-CM

## 2018-07-21 LAB — COMPREHENSIVE METABOLIC PANEL
ALBUMIN: 3.2 g/dL — AB (ref 3.5–5.0)
ALT: 8 U/L (ref 0–44)
ANION GAP: 7 (ref 5–15)
AST: 23 U/L (ref 15–41)
Alkaline Phosphatase: 67 U/L (ref 38–126)
BUN: 8 mg/dL (ref 8–23)
CHLORIDE: 99 mmol/L (ref 98–111)
CO2: 35 mmol/L — AB (ref 22–32)
Calcium: 9.5 mg/dL (ref 8.9–10.3)
Creatinine, Ser: 0.88 mg/dL (ref 0.44–1.00)
GFR calc non Af Amer: 51 mL/min — ABNORMAL LOW (ref >60)
GFR, EST AFRICAN AMERICAN: 59 mL/min — AB (ref >60)
GLUCOSE: 106 mg/dL — AB (ref 70–99)
POTASSIUM: 3.3 mmol/L — AB (ref 3.5–5.1)
SODIUM: 141 mmol/L (ref 135–145)
Total Bilirubin: 0.6 mg/dL (ref 0.3–1.2)
Total Protein: 6.4 g/dL — ABNORMAL LOW (ref 6.5–8.1)

## 2018-07-21 LAB — CBC WITH DIFFERENTIAL/PLATELET
ABS IMMATURE GRANULOCYTES: 0.02 10*3/uL (ref 0.00–0.07)
BASOS ABS: 0 10*3/uL (ref 0.0–0.1)
Basophils Relative: 0 %
Eosinophils Absolute: 0.2 10*3/uL (ref 0.0–0.5)
Eosinophils Relative: 3 %
HCT: 47.9 % — ABNORMAL HIGH (ref 36.0–46.0)
Hemoglobin: 14.6 g/dL (ref 12.0–15.0)
IMMATURE GRANULOCYTES: 0 %
LYMPHS PCT: 35 %
Lymphs Abs: 2.4 10*3/uL (ref 0.7–4.0)
MCH: 27.7 pg (ref 26.0–34.0)
MCHC: 30.5 g/dL (ref 30.0–36.0)
MCV: 90.9 fL (ref 80.0–100.0)
MONO ABS: 0.6 10*3/uL (ref 0.1–1.0)
Monocytes Relative: 9 %
NEUTROS ABS: 3.6 10*3/uL (ref 1.7–7.7)
NEUTROS PCT: 53 %
NRBC: 0 % (ref 0.0–0.2)
PLATELETS: 239 10*3/uL (ref 150–400)
RBC: 5.27 MIL/uL — AB (ref 3.87–5.11)
RDW: 13.8 % (ref 11.5–15.5)
WBC: 6.9 10*3/uL (ref 4.0–10.5)

## 2018-07-21 LAB — URINALYSIS, ROUTINE W REFLEX MICROSCOPIC
Bilirubin Urine: NEGATIVE
Glucose, UA: NEGATIVE mg/dL
HGB URINE DIPSTICK: NEGATIVE
Ketones, ur: NEGATIVE mg/dL
Leukocytes, UA: NEGATIVE
NITRITE: NEGATIVE
PROTEIN: NEGATIVE mg/dL
Specific Gravity, Urine: 1.005 (ref 1.005–1.030)
pH: 9 — ABNORMAL HIGH (ref 5.0–8.0)

## 2018-07-21 LAB — PROTIME-INR
INR: 1.16
Prothrombin Time: 14.7 seconds (ref 11.4–15.2)

## 2018-07-21 MED ORDER — TETANUS-DIPHTH-ACELL PERTUSSIS 5-2.5-18.5 LF-MCG/0.5 IM SUSP
0.5000 mL | Freq: Once | INTRAMUSCULAR | Status: AC
  Administered 2018-07-21: 0.5 mL via INTRAMUSCULAR
  Filled 2018-07-21: qty 0.5

## 2018-07-21 MED ORDER — POTASSIUM CHLORIDE CRYS ER 20 MEQ PO TBCR
40.0000 meq | EXTENDED_RELEASE_TABLET | Freq: Once | ORAL | Status: AC
  Administered 2018-07-21: 40 meq via ORAL
  Filled 2018-07-21: qty 2

## 2018-07-21 MED ORDER — ACETAMINOPHEN 500 MG PO TABS
1000.0000 mg | ORAL_TABLET | Freq: Once | ORAL | Status: AC
  Administered 2018-07-21: 1000 mg via ORAL
  Filled 2018-07-21: qty 2

## 2018-07-21 NOTE — ED Notes (Signed)
Bacitracin and Gauze applied to right forearm for skin tear

## 2018-07-21 NOTE — ED Provider Notes (Signed)
Briefly, 84 yo F here with fall, likely mechanical. Labs reassuring/at baseline. K 3.3 - replaced. Awaiting UA. Imaging negative. Can be d/c per Dr. Leonides Schanz if UA negative.  UA negative. Pt is on Hospice w/ [facility] - will monitor wound/sx. D/c home.   Duffy Bruce, MD 07/21/18 [phone]

## 2018-07-21 NOTE — ED Provider Notes (Signed)
TIME SEEN: 5:35 AM  CHIEF COMPLAINT: Fall  HPI: Savannah Parks is 84 year old female with history of hypertension, hyperlipidemia, pulmonary embolus on Eliquis, bladder cancer who presents to the emergency department after she had a witnessed fall at home.  Savannah Parks tells me that she is not sure why she fell but thinks that she may have lost her balance.  She did strike her head but there is no loss of consciousness.  She is complaining of pain all over.  Denies any preceding chest pain or shortness of breath, dizziness.  No recent fevers, cough, vomiting or diarrhea.  Has chronic lower extremity swelling which she reports has been there for months.  EMS reports skin tear to the right elbow.  She is unsure of her last tetanus vaccination.  She normally ambulates with a walker.  ROS: See HPI Constitutional: no fever  Eyes: no drainage  ENT: no runny nose   Cardiovascular:  no chest pain  Resp: no SOB  GI: no vomiting GU: no dysuria Integumentary: no rash  Allergy: no hives  Musculoskeletal: Chronic bilateral leg swelling  Neurological: no slurred speech ROS otherwise negative  PAST MEDICAL HISTORY/PAST SURGICAL HISTORY:  Past Medical History:  Diagnosis Date  . Cancer [facility])    Bladder  . Hiatal hernia   . High cholesterol   . HOH (hard of hearing)   . Hypertension   . Legally blind   . PE (pulmonary thromboembolism) ([place]) 09/2016  . Sinus drainage     MEDICATIONS:  Prior to Admission medications   Medication Sig Start Date End Date Taking? Authorizing Provider  acetaminophen (TYLENOL) 325 MG tablet Take 2 tablets (650 mg total) by mouth every 6 (six) hours as needed for mild pain (or Fever >/= 101). Savannah Parks taking differently: Take 325-650 mg by mouth 2 (two) times daily.  09/30/16   Robbie Lis, MD  alendronate (FOSAMAX) 70 MG tablet Take 70 mg by mouth every Tuesday.  07/25/16   [provider]  apixaban (ELIQUIS) 5 MG TABS tablet Take 1 tablet (5 mg total) by mouth 2  (two) times daily. Savannah Parks taking differently: Take 2.5 mg by mouth 2 (two) times daily.  10/07/16   Robbie Lis, MD  Calcium Carbonate (CALCIUM 600 PO) Take 600 mg by mouth daily.     [provider]  Cholecalciferol (VITAMIN D-3) 1000 units CAPS Take 1,000 Units by mouth daily.     [provider]  docusate sodium (COLACE) 100 MG capsule Take 100 mg by mouth daily as needed for mild constipation.    [provider]  feeding supplement, ENSURE ENLIVE, (ENSURE ENLIVE) LIQD Take 237 mLs by mouth 2 (two) times daily between meals. 04/27/17   Carlyle Dolly, MD  furosemide (LASIX) 80 MG tablet Take 1 tablet (80 mg total) by mouth 2 (two) times daily. 04/27/17   Carlyle Dolly, MD  Glycerin-Hypromellose-PEG 400 (VISINE TEARS) 0.2-0.2-1 % SOLN Place 1 drop into both eyes at bedtime.    [provider]  KLOR-CON 10 10 MEQ tablet Take 10 mEq by mouth daily. 04/14/17   [provider]  Omega-3 Fatty Acids (FISH OIL) 1000 MG CAPS Take 1,000 mg by mouth daily.    [provider]  OVER THE COUNTER MEDICATION Take 1 tablet by mouth daily. Arti-Clear supplement    [provider]  QUEtiapine (SEROQUEL) 25 MG tablet Take 25 mg by mouth daily. 03/13/17   [provider]  senna (SENOKOT) 8.6 MG TABS tablet Take  1 tablet (8.6 mg total) by mouth at bedtime. 09/30/16   Robbie Lis, MD  sucralfate (CARAFATE) 1 g tablet Take 1 g by mouth 2 (two) times daily as needed (ulcers).  04/13/17   [provider]    ALLERGIES:  Allergies  Allergen Reactions  . Tape Other (See Comments)    SKIN IS VERY THIN; TAPE WILL TEAR AND BRUISE THE Savannah Parks'S SKIN!! MUST USE PAPER TAPE  . Keflet [Cephalexin] Rash  . Sulfa Antibiotics Rash    SOCIAL HISTORY:  Social History   Tobacco Use  . Smoking status: Former Research scientist (life sciences)  . Smokeless tobacco: Former Systems developer    Types: Snuff  Substance Use Topics  . Alcohol use: No    FAMILY HISTORY: Family  History  Problem Relation Age of Onset  . Cancer Father   . Cancer Sister   . Cancer Brother     EXAM: BP (!) 194/92 (BP Location: Right Arm)   Pulse 91   Temp 98.7 F (37.1 C) (Oral)   Resp (!) 25   Ht 5\' 4"  (1.626 m)   Wt 68 kg   SpO2 100%   BMI 25.75 kg/m  CONSTITUTIONAL: Alert and oriented to person and place but not time.  Elderly.  No distress. HEAD: Normocephalic; atraumatic EYES: Conjunctivae clear, PERRL, EOMI ENT: normal nose; no rhinorrhea; moist mucous membranes; pharynx without lesions noted; no dental injury; no septal hematoma NECK: Supple, no meningismus, no LAD; no midline spinal tenderness, step-off or deformity; trachea midline CARD: RRR; S1 and S2 appreciated; no murmurs, no clicks, no rubs, no gallops RESP: Normal chest excursion without splinting or tachypnea; breath sounds clear and equal bilaterally; no wheezes, no rhonchi, no rales; no hypoxia or respiratory distress CHEST:  chest wall stable, no crepitus or ecchymosis or deformity, nontender to palpation; no flail chest ABD/GI: Normal bowel sounds; non-distended; soft, non-tender, no rebound, no guarding; no ecchymosis or other lesions noted PELVIS:  stable, nontender to palpation BACK:  The back appears normal and is non-tender to palpation, there is no CVA tenderness; no midline spinal tenderness, step-off or deformity EXT: Skin tear to the right elbow with associated bruising.  Full range of motion in this joint.  Normal ROM in all joints; otherwise extremities are non-tender to palpation; no edema; normal capillary refill; no cyanosis, no bony tenderness or bony deformity of Savannah Parks's extremities, no joint effusion, compartments are soft, extremities are warm and well-perfused, no ecchymosis SKIN: Normal color for age and race; warm NEURO: Moves all extremities equally, normal speech, no facial asymmetry PSYCH: The Savannah Parks's mood and manner are appropriate. Grooming and personal hygiene are  appropriate.  MEDICAL DECISION MAKING: Savannah Parks here after witnessed fall.  States she is unsure why she fell today.  States she thinks she may have lost her balance but is unsure.  Will check basic labs, EKG, urinalysis.  Will give Tylenol for pain.  We will update her tetanus vaccination.  She does not need any sutures to her skin tear.  We will clean and dress this wound.  Will obtain CT of her head and cervical spine.  We will also obtain x-ray of this right elbow given significant ecchymosis noted.  ED PROGRESS: Savannah Parks's imaging shows no acute injury.  Her labs are unremarkable.  Urine is pending.  Signed out to Dr. Ellender Hose to follow-up on Savannah Parks's urinalysis.  Anticipate discharge home.  Family comfortable with this plan.  Daughter and son are at bedside.  I reviewed all nursing notes, vitals,  pertinent previous records, EKGs, lab and urine results, imaging (as available).     EKG Interpretation  Date/Time:  Wednesday July 21 2018 05:49:20 EST Ventricular Rate:  80 PR Interval:    QRS Duration: 140 QT Interval:  405 QTC Calculation: 468 R Axis:   -61 Text Interpretation:  Sinus rhythm Atrial premature complex Short PR interval RBBB and LAFB Probable left ventricular hypertrophy No significant change since last tracing Confirmed by , Cyril Mourning [phone]) on 07/21/2018 6:18:27 AM          , Delice Bison, DO 07/21/18 [id number]

## 2018-07-21 NOTE — ED Notes (Signed)
[facility] at bedside to transport pt home 

## 2018-07-21 NOTE — ED Notes (Signed)
Pt taken to Xray.

## 2018-07-21 NOTE — ED Triage Notes (Signed)
Pt BIB [facility] for a mechanical fall. Pt was reported to be on her way back from the bathroom when she tripped and fell. No reported LOC. Pt complains of pain to the back of her neck and her right elbow. EMS reports vital signs stable. EMS reports pt uses a rolling walker when this happened. EMS reports the pt had some decreased oxygen saturations and the placed her on 2 liters which she is on PRN.   Pt states she tripped and fell while coming back from the bathroom. Pt reports head, right elbow, and belly pain all from the fall.

## 2018-09-19 ENCOUNTER — Emergency Department (HOSPITAL_COMMUNITY)

## 2018-09-19 ENCOUNTER — Encounter (HOSPITAL_COMMUNITY): Payer: Self-pay | Admitting: Emergency Medicine

## 2018-09-19 ENCOUNTER — Other Ambulatory Visit: Payer: Self-pay

## 2018-09-19 ENCOUNTER — Emergency Department (HOSPITAL_COMMUNITY)
Admission: EM | Admit: 2018-09-19 | Discharge: 2018-09-20 | Disposition: A | Discharge status: Home / Self Care | Attending: Emergency Medicine | Admitting: Emergency Medicine

## 2018-09-19 DIAGNOSIS — Z87891 Personal history of nicotine dependence: Secondary | ICD-10-CM | POA: Insufficient documentation

## 2018-09-19 DIAGNOSIS — I5032 Chronic diastolic (congestive) heart failure: Secondary | ICD-10-CM | POA: Diagnosis not present

## 2018-09-19 DIAGNOSIS — R079 Chest pain, unspecified: Secondary | ICD-10-CM | POA: Insufficient documentation

## 2018-09-19 DIAGNOSIS — I110 Hypertensive heart disease with heart failure: Secondary | ICD-10-CM | POA: Insufficient documentation

## 2018-09-19 DIAGNOSIS — Z7901 Long term (current) use of anticoagulants: Secondary | ICD-10-CM | POA: Diagnosis not present

## 2018-09-19 DIAGNOSIS — Z79899 Other long term (current) drug therapy: Secondary | ICD-10-CM | POA: Insufficient documentation

## 2018-09-19 NOTE — ED Provider Notes (Signed)
[facility] EMERGENCY DEPARTMENT Provider Note   CSN: [id number] Arrival date & time: 09/19/18  2326     History   Chief Complaint Chief Complaint  Patient presents with  . Chest Pain    HPI Savannah Parks is a 85 y.o. female.  Patient presents to the emergency department from home for evaluation of chest pain.  Patient developed chest pain this evening.  She took 3 sublingual nitroglycerin over a period of several minutes and the pain has now resolved.  Patient does not have any shortness of breath.     Past Medical History:  Diagnosis Date  . Cancer [facility])    Bladder  . Hiatal hernia   . High cholesterol   . HOH (hard of hearing)   . Hypertension   . Legally blind   . PE (pulmonary thromboembolism) ([place]) 09/2016  . Sinus drainage     Patient Active Problem List   Diagnosis Date Noted  . Community acquired pneumonia   . Peripheral edema   . Acute on chronic congestive heart failure ([place])   . Weakness 04/25/2017  . Cellulitis of leg, right 11/20/2016  . Anemia of chronic disease 11/20/2016  . GERD (gastroesophageal reflux disease) 11/20/2016  . Diastolic CHF, acute on chronic (HCC) 11/19/2016  . Failure to thrive in adult 11/19/2016  . Pulmonary embolism ([place]) 09/27/2016  . HCAP (healthcare-associated pneumonia) 09/27/2016  . Proximal humeral fracture 08/16/2016  . Essential hypertension 08/16/2016  . Bradycardia 08/16/2016  . Pressure injury of skin 08/16/2016  . Closed torus fracture of upper end of right humerus     Past Surgical History:  Procedure Laterality Date  . BLADDER REMOVAL  Nov. 1993   part  . DILATION AND CURETTAGE OF UTERUS     50+ years     OB History   No obstetric history on file.      Home Medications    Prior to Admission medications   Medication Sig Start Date End Date Taking? Authorizing Provider  acetaminophen (TYLENOL) 325 MG tablet Take 2 tablets (650 mg total) by mouth every 6 (six) hours as needed  for mild pain (or Fever >/= 101). 09/30/16  Yes Robbie Lis, MD  apixaban (ELIQUIS) 5 MG TABS tablet Take 1 tablet (5 mg total) by mouth 2 (two) times daily. Patient taking differently: Take 2.5 mg by mouth 2 (two) times daily.  10/07/16  Yes Robbie Lis, MD  Calcium Carbonate (CALCIUM 600 PO) Take 600 mg by mouth daily.    Yes [provider]  Cholecalciferol (VITAMIN D-3) 1000 units CAPS Take 1,000 Units by mouth daily.    Yes [provider]  docusate sodium (COLACE) 100 MG capsule Take 100 mg by mouth daily as needed for mild constipation.   Yes [provider]  feeding supplement, ENSURE ENLIVE, (ENSURE ENLIVE) LIQD Take 237 mLs by mouth 2 (two) times daily between meals. 04/27/17  Yes Carlyle Dolly, MD  furosemide (LASIX) 80 MG tablet Take 1 tablet (80 mg total) by mouth 2 (two) times daily. Patient taking differently: Take 80 mg by mouth daily.  04/27/17  Yes Carlyle Dolly, MD  Glycerin-Hypromellose-PEG 400 (VISINE TEARS) 0.2-0.2-1 % SOLN Place 1 drop into both eyes at bedtime.   Yes [provider]  HYDROcodone-acetaminophen (NORCO/VICODIN) 5-325 MG tablet Take 1 tablet by mouth at bedtime as needed for moderate pain.    Yes [provider]  KLOR-CON 10 10 MEQ tablet Take 10 mEq by  mouth daily. 04/14/17  Yes [provider]  nitroGLYCERIN (NITROSTAT) 0.4 MG SL tablet Place 0.4 mg under the tongue every 5 (five) minutes as needed for chest pain.   Yes [provider]  OVER THE COUNTER MEDICATION Take 1 tablet by mouth daily. Arti-Clear supplement   Yes [provider]  senna (SENOKOT) 8.6 MG TABS tablet Take 1 tablet (8.6 mg total) by mouth at bedtime. 09/30/16  Yes Robbie Lis, MD  sucralfate (CARAFATE) 1 g tablet Take 1 g by mouth 2 (two) times daily.  04/13/17  Yes [provider]    Family History Family History  Problem Relation Age of Onset  . Cancer Father   . Cancer Sister   . Cancer  Brother     Social History Social History   Tobacco Use  . Smoking status: Former Research scientist (life sciences)  . Smokeless tobacco: Former Systems developer    Types: Snuff  Substance Use Topics  . Alcohol use: No  . Drug use: No     Allergies   Tape; Keflet [cephalexin]; and Sulfa antibiotics   Review of Systems Review of Systems  Cardiovascular: Positive for chest pain.  All other systems reviewed and are negative.    Physical Exam Updated Vital Signs BP (!) 147/62   Pulse 65   Temp 98.1 F (36.7 C) (Oral)   Resp (!) 22   SpO2 100%   Physical Exam Vitals signs and nursing note reviewed.  Constitutional:      General: She is not in acute distress.    Appearance: Normal appearance. She is well-developed.  HENT:     Head: Normocephalic and atraumatic.     Right Ear: Hearing normal.     Left Ear: Hearing normal.     Nose: Nose normal.  Eyes:     Conjunctiva/sclera: Conjunctivae normal.     Pupils: Pupils are equal, round, and reactive to light.  Neck:     Musculoskeletal: Normal range of motion and neck supple.  Cardiovascular:     Rate and Rhythm: Regular rhythm.     Heart sounds: S1 normal and S2 normal. No murmur. No friction rub. No gallop.   Pulmonary:     Effort: Pulmonary effort is normal. No respiratory distress.     Breath sounds: Normal breath sounds.  Chest:     Chest wall: No tenderness.  Abdominal:     General: Bowel sounds are normal.     Palpations: Abdomen is soft.     Tenderness: There is no abdominal tenderness. There is no guarding or rebound. Negative signs include Murphy's sign and McBurney's sign.     Hernia: No hernia is present.  Musculoskeletal: Normal range of motion.     Right lower leg: Edema present.     Left lower leg: Edema present.  Skin:    General: Skin is warm and dry.     Findings: No rash.     Comments: Thickening and lichenification of both lower extremities with mild erythema consistent with chronic edema  Neurological:     Mental Status: She  is alert and oriented to person, place, and time.     GCS: GCS eye subscore is 4. GCS verbal subscore is 5. GCS motor subscore is 6.     Cranial Nerves: No cranial nerve deficit.     Sensory: No sensory deficit.     Coordination: Coordination normal.  Psychiatric:        Speech: Speech normal.  Behavior: Behavior normal.        Thought Content: Thought content normal.      ED Treatments / Results  Labs (all labs ordered are listed, but only abnormal results are displayed) Labs Reviewed  BASIC METABOLIC PANEL - Abnormal; Notable for the following components:      Result Value   Potassium 3.0 (*)    Chloride 97 (*)    CO2 34 (*)    Glucose, Bld 104 (*)    BUN 6 (*)    GFR calc non Af Amer 53 (*)    All other components within normal limits  CBC WITH DIFFERENTIAL/PLATELET  BRAIN NATRIURETIC PEPTIDE  I-STAT TROPONIN, ED  I-STAT TROPONIN, ED    EKG EKG Interpretation  Date/Time:  Sunday September 19 2018 23:44:20 EST Ventricular Rate:  70 PR Interval:  212 QRS Duration: 126 QT Interval:  428 QTC Calculation: 462 R Axis:   -46 Text Interpretation:  Sinus rhythm with 1st degree A-V block Right bundle branch block Left anterior fascicular block *Bifascicular block* Abnormal ECG No significant change since last tracing Confirmed by Orpah Greek [phone]) on 09/19/2018 11:48:43 PM   Radiology Dg Chest Port 1 View  Result Date: 09/20/2018 CLINICAL DATA:  Chest pain shortness of breath and nausea for couple of hours. EXAM: PORTABLE CHEST 1 VIEW COMPARISON:  04/25/2017 FINDINGS: Cardiac enlargement. Shallow inspiration. Probable small bilateral pleural effusions. Consolidation or atelectasis in the left lung base. Probable emphysematous changes in the upper lungs. Mediastinal contours appear intact. No pneumothorax. Calcified and tortuous aorta. Degenerative changes in the spine and shoulders. Old healed fracture deformity of the right proximal humerus. IMPRESSION:  Cardiac enlargement. Small bilateral pleural effusions with infiltration or atelectasis in the left lung base. Electronically Signed   By: Lucienne Capers M.D.   On: 09/20/2018 00:13    Procedures Procedures (including critical care time)  Medications Ordered in ED Medications - No data to display   Initial Impression / Assessment and Plan / ED Course  I have reviewed the triage vital signs and the nursing notes.  Pertinent labs & imaging results that were available during my care of the patient were reviewed by me and considered in my medical decision making (see chart for details).     Presents to the emergency department for evaluation of chest pain.  Patient had onset of pain at home earlier tonight, took 3 sublingual nitroglycerin from her own supply and pain resolved.  By the time EMS arrived on the scene she had no further pain.  She was brought to the ER and has done well.  No recurrence of pain during her evaluation here.  EKG unchanged from previous.  Troponin negative x2.  No evidence of MI.  Feel that the patient would be better served being discharged and being admitted here during flu season.  She can follow-up as an outpatient and return if she has recurrent chest pain.  Final Clinical Impressions(s) / ED Diagnoses   Final diagnoses:  Chest pain, unspecified type    ED Discharge Orders    None       Orpah Greek, MD 09/20/18 [phone]

## 2018-09-19 NOTE — ED Triage Notes (Signed)
Per [facility], Pt from home with daughter. Pt reports CP starting around 10 pm tonight. Pt states she took 3 nitro at home, which relieved her pain. Pt currently denies CP. Pt reports some SHOB, denies N/V. Pt has some swelling and redness to bilateral lower extremities.

## 2018-09-19 NOTE — ED Notes (Signed)
XR at bedside

## 2018-09-20 LAB — CBC WITH DIFFERENTIAL/PLATELET
Abs Immature Granulocytes: 0.04 10*3/uL (ref 0.00–0.07)
BASOS ABS: 0 10*3/uL (ref 0.0–0.1)
BASOS PCT: 0 %
EOS ABS: 0.2 10*3/uL (ref 0.0–0.5)
EOS PCT: 2 %
HCT: 44.8 % (ref 36.0–46.0)
Hemoglobin: 13.8 g/dL (ref 12.0–15.0)
Immature Granulocytes: 1 %
LYMPHS PCT: 43 %
Lymphs Abs: 3.3 10*3/uL (ref 0.7–4.0)
MCH: 28.2 pg (ref 26.0–34.0)
MCHC: 30.8 g/dL (ref 30.0–36.0)
MCV: 91.6 fL (ref 80.0–100.0)
MONO ABS: 0.7 10*3/uL (ref 0.1–1.0)
Monocytes Relative: 9 %
NRBC: 0 % (ref 0.0–0.2)
Neutro Abs: 3.5 10*3/uL (ref 1.7–7.7)
Neutrophils Relative %: 45 %
PLATELETS: 173 10*3/uL (ref 150–400)
RBC: 4.89 MIL/uL (ref 3.87–5.11)
RDW: 13.7 % (ref 11.5–15.5)
WBC: 7.7 10*3/uL (ref 4.0–10.5)

## 2018-09-20 LAB — BRAIN NATRIURETIC PEPTIDE: B NATRIURETIC PEPTIDE 5: 77.7 pg/mL (ref 0.0–100.0)

## 2018-09-20 LAB — BASIC METABOLIC PANEL
Anion gap: 9 (ref 5–15)
BUN: 6 mg/dL — AB (ref 8–23)
CHLORIDE: 97 mmol/L — AB (ref 98–111)
CO2: 34 mmol/L — ABNORMAL HIGH (ref 22–32)
CREATININE: 0.87 mg/dL (ref 0.44–1.00)
Calcium: 9.2 mg/dL (ref 8.9–10.3)
GFR calc non Af Amer: 53 mL/min — ABNORMAL LOW (ref >60)
Glucose, Bld: 104 mg/dL — ABNORMAL HIGH (ref 70–99)
Potassium: 3 mmol/L — ABNORMAL LOW (ref 3.5–5.1)
SODIUM: 140 mmol/L (ref 135–145)

## 2018-09-20 LAB — I-STAT TROPONIN, ED
Troponin i, poc: 0.01 ng/mL (ref 0.00–0.08)
Troponin i, poc: 0.01 ng/mL (ref 0.00–0.08)

## 2018-09-20 NOTE — ED Notes (Signed)
Called ptar ar 3:39

## 2019-04-09 DEATH — deceased

## 2019-05-13 IMAGING — CT CT HEAD W/O CM
4 series · 17 of 47 positions shown, 19 images · non-contrast
Comparison: 11/19/2016

CLINICAL DATA: Fall with trauma to back of head.

EXAM:
CT HEAD WITHOUT CONTRAST
TECHNIQUE: Contiguous axial images were obtained from the base of the skull
through the vertex without intravenous contrast.

[Series 3: head without · axial · non-contrast · 0.43mm/px · z∈[-152,-32]mm · 7 of 32 slices shown, 9 images]
[im 4/32  brain]
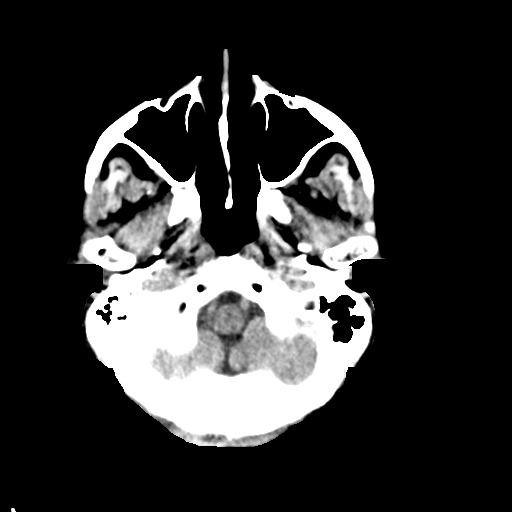
[im 4/32  bone]
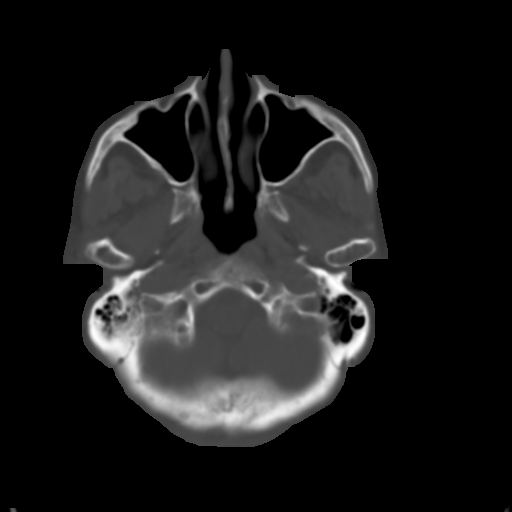
[im 8/32  brain]
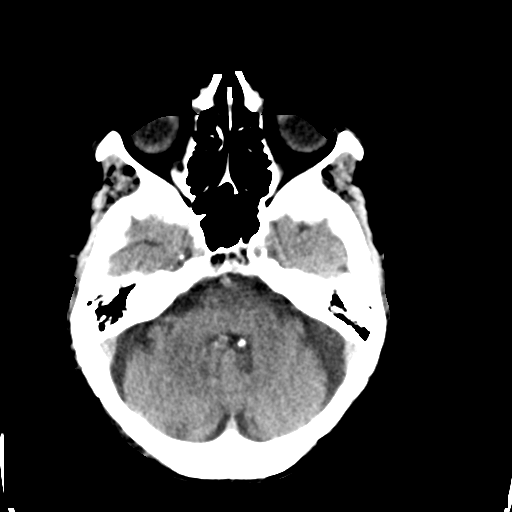
[im 12/32  brain]
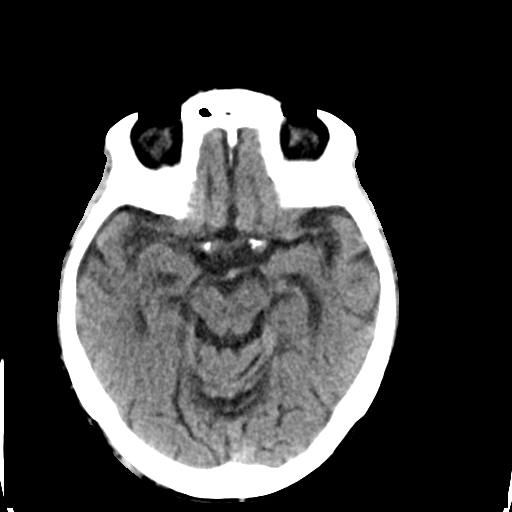
[im 16/32  brain]
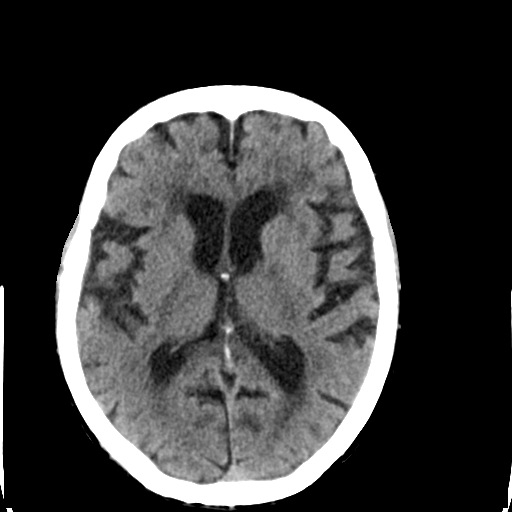
[im 20/32  brain]
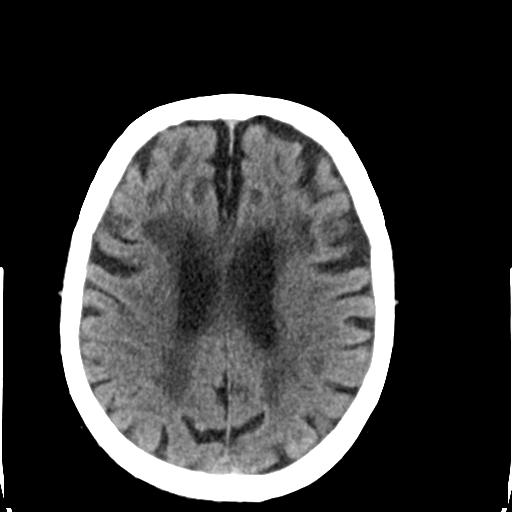
[im 20/32  bone]
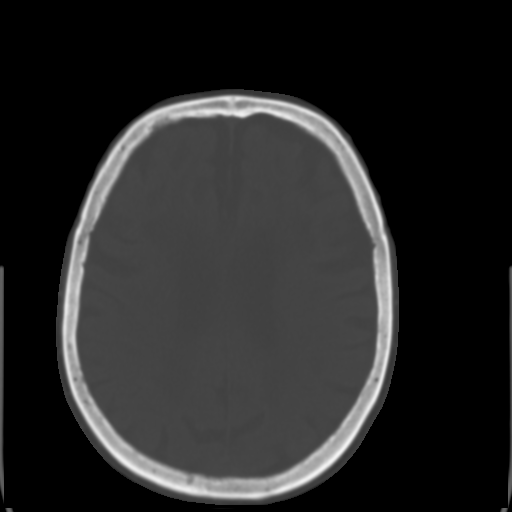
[im 24/32  brain]
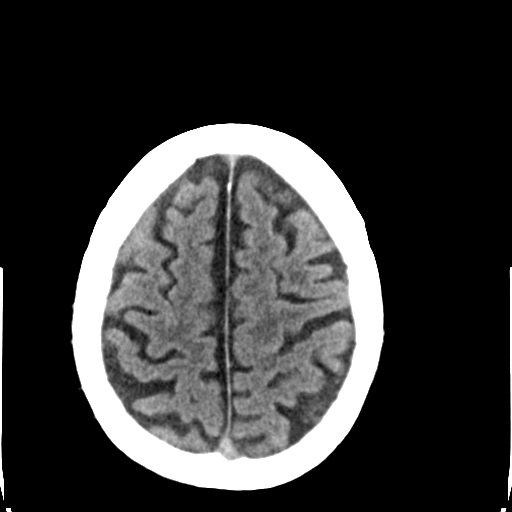
[im 28/32  brain]
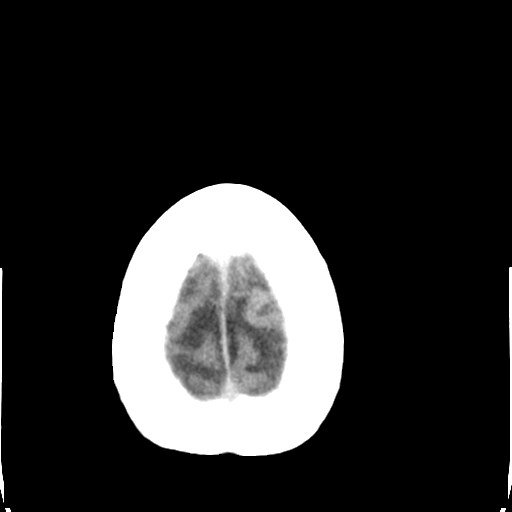

[Series 4: head bone · axial · 0.43mm/px · z∈[-153,-97]mm · 4 of 80 slices shown]
[im 8/80  bone]
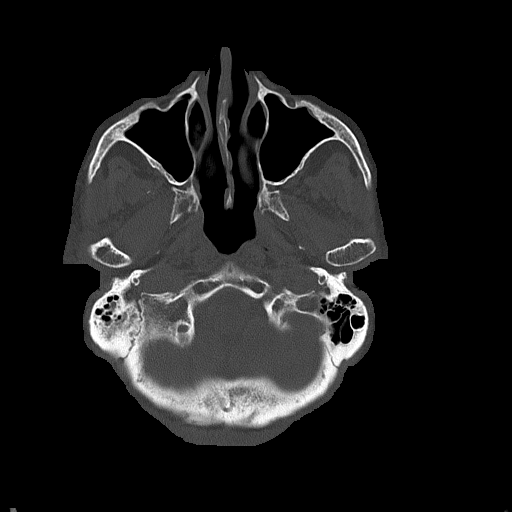
[im 16/80  bone]
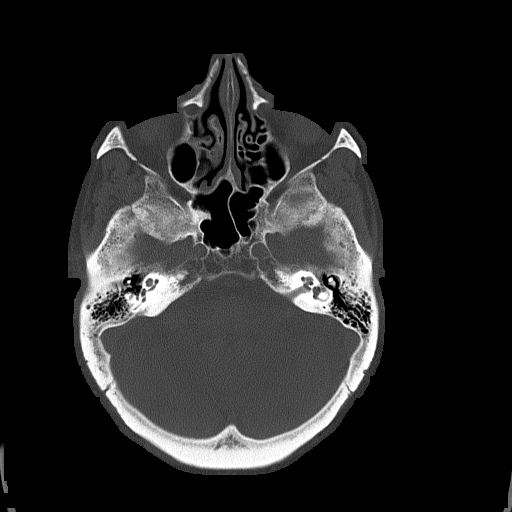
[im 24/80  bone]
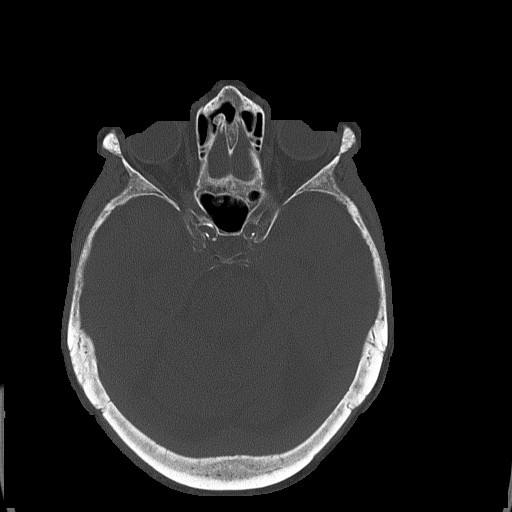
[im 36/80  bone]
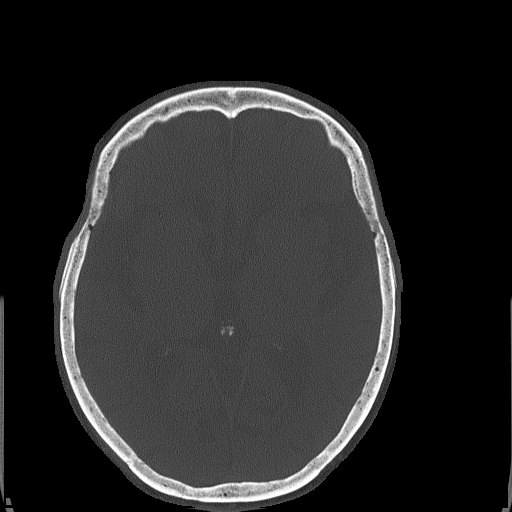

[Series 5: head without cor · coronal · non-contrast · 0.31mm/px · 3 of 67 slices shown]
[im 23/67  brain]
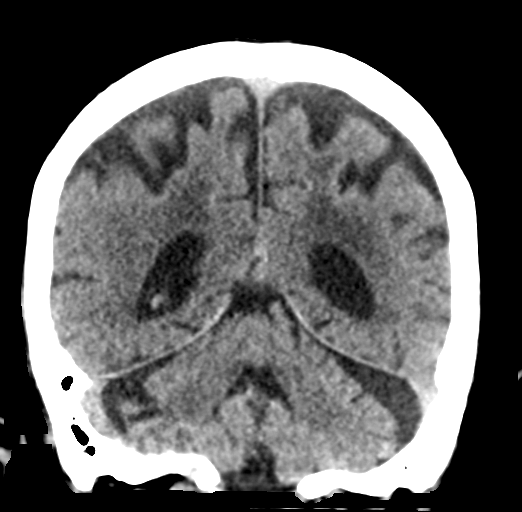
[im 30/67  brain]
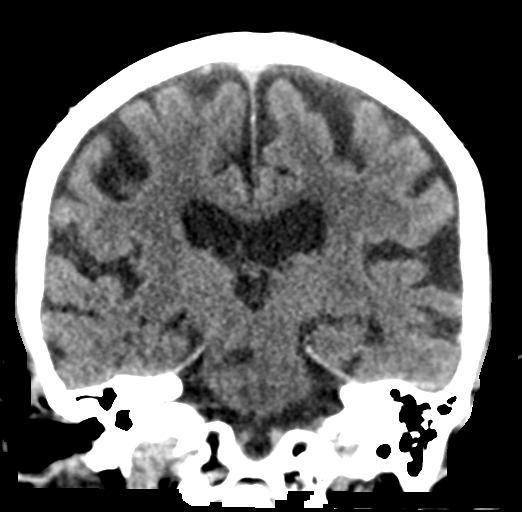
[im 37/67  brain]
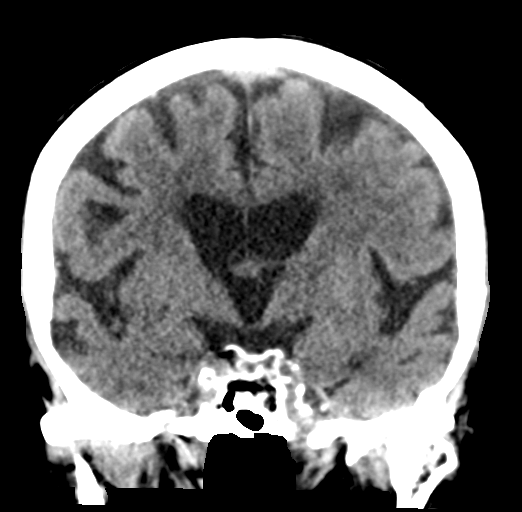

[Series 6: head without sag · sagittal · non-contrast · 0.31mm/px · 3 of 61 slices shown]
[im 21/61  brain]
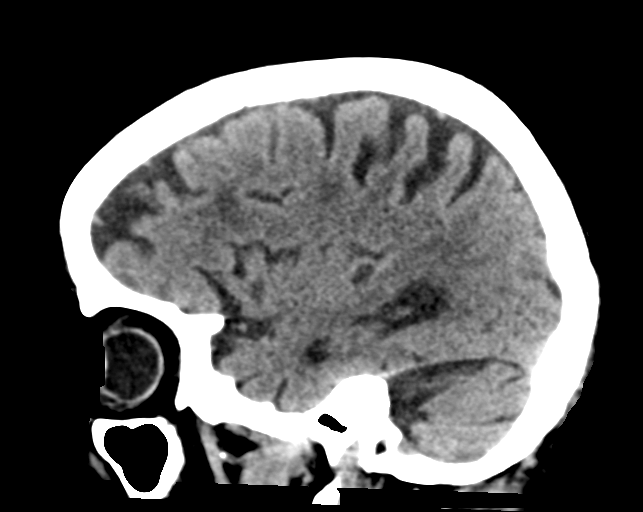
[im 31/61  brain]
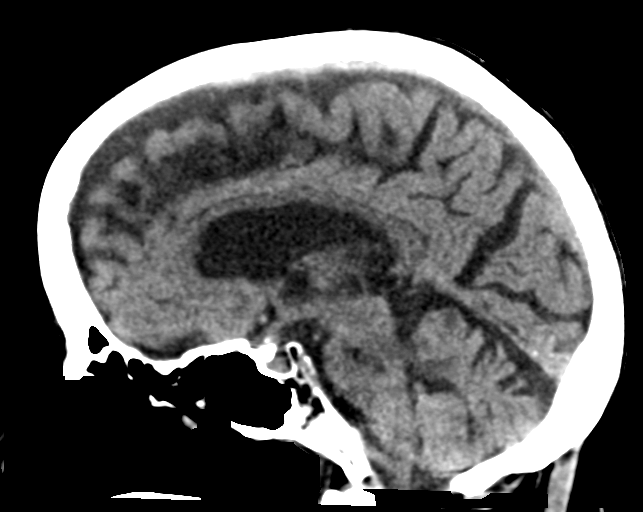
[im 41/61  brain]
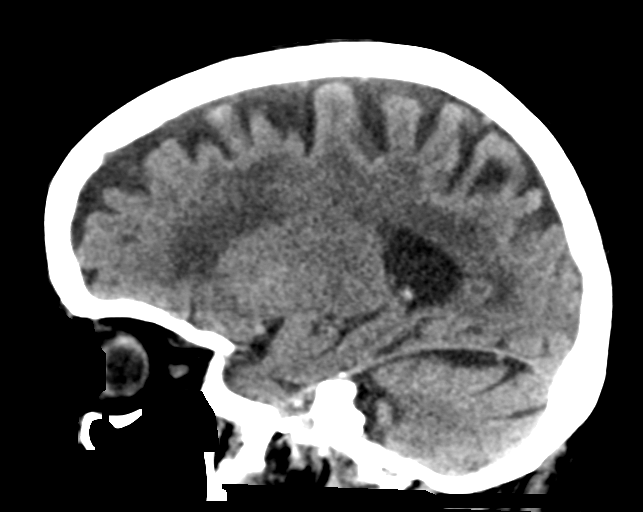

[17 of 47 positions shown; findings below may reference images not displayed]

FINDINGS: Brain: Expected cerebral volume loss for age. Moderate low density
in the periventricular white matter likely related to small vessel
disease. Cerebellar atrophy is also age appropriate. Artifact versus
remote lacunar infarct in the pons on image 11/series 3. No mass
lesion, hemorrhage, hydrocephalus, acute infarct, intra-axial, or
extra-axial fluid collection.

Vascular: Intracranial atherosclerosis.

Skull: Posterior right scalp soft tissue swelling is relatively mild
on image 26/series 4. No skull fracture.

Sinuses/Orbits: Bilateral lens extractions. Clear paranasal sinuses
and mastoid air cells.

Other: None.
IMPRESSION: 1. Posterior right scalp soft tissue swelling, without acute
intracranial abnormality.
2.  Cerebral atrophy and small vessel ischemic change.
